# Patient Record
Sex: Female | Born: 1965 | Race: White | Hispanic: No | Marital: Married | Smoking: Never smoker
Health system: Southern US, Community
[De-identification: ages and names within clinical notes are randomized; demographics above are authoritative.]

## PROBLEM LIST (undated history)

## (undated) DIAGNOSIS — E785 Hyperlipidemia, unspecified: Secondary | ICD-10-CM

## (undated) DIAGNOSIS — E039 Hypothyroidism, unspecified: Secondary | ICD-10-CM

## (undated) DIAGNOSIS — Z789 Other specified health status: Secondary | ICD-10-CM

## (undated) DIAGNOSIS — E079 Disorder of thyroid, unspecified: Secondary | ICD-10-CM

## (undated) DIAGNOSIS — M8580 Other specified disorders of bone density and structure, unspecified site: Secondary | ICD-10-CM

## (undated) HISTORY — DX: Disorder of thyroid, unspecified: E07.9

## (undated) HISTORY — PX: INNER EAR SURGERY: SHX679

## (undated) HISTORY — PX: EYE SURGERY: SHX253

## (undated) HISTORY — DX: Hyperlipidemia, unspecified: E78.5

## (undated) HISTORY — DX: Other specified disorders of bone density and structure, unspecified site: M85.80

---

## 2005-05-16 ENCOUNTER — Ambulatory Visit: Payer: Self-pay | Admitting: Unknown Physician Specialty

## 2005-07-25 ENCOUNTER — Observation Stay: Payer: Self-pay | Admitting: Unknown Physician Specialty

## 2008-09-19 ENCOUNTER — Ambulatory Visit: Payer: Self-pay | Admitting: Family Medicine

## 2010-03-18 ENCOUNTER — Ambulatory Visit: Payer: Self-pay | Admitting: Unknown Physician Specialty

## 2010-03-23 ENCOUNTER — Ambulatory Visit: Payer: Self-pay | Admitting: Unknown Physician Specialty

## 2016-01-20 ENCOUNTER — Other Ambulatory Visit: Payer: Self-pay | Admitting: Internal Medicine

## 2016-01-20 DIAGNOSIS — R1310 Dysphagia, unspecified: Secondary | ICD-10-CM

## 2016-01-26 ENCOUNTER — Ambulatory Visit: Payer: Self-pay

## 2016-02-02 ENCOUNTER — Ambulatory Visit: Payer: Self-pay

## 2017-10-20 ENCOUNTER — Other Ambulatory Visit: Payer: Self-pay | Admitting: Radiology

## 2017-10-20 DIAGNOSIS — R1011 Right upper quadrant pain: Secondary | ICD-10-CM

## 2017-10-25 ENCOUNTER — Ambulatory Visit
Admission: RE | Admit: 2017-10-25 | Discharge: 2017-10-25 | Disposition: A | Discharge status: Home / Self Care | Payer: BLUE CROSS/BLUE SHIELD | Source: Ambulatory Visit | Attending: Family Medicine | Admitting: Family Medicine

## 2017-10-25 DIAGNOSIS — K8020 Calculus of gallbladder without cholecystitis without obstruction: Secondary | ICD-10-CM | POA: Diagnosis not present

## 2017-10-25 DIAGNOSIS — K760 Fatty (change of) liver, not elsewhere classified: Secondary | ICD-10-CM | POA: Insufficient documentation

## 2017-10-25 DIAGNOSIS — R1011 Right upper quadrant pain: Secondary | ICD-10-CM | POA: Insufficient documentation

## 2017-12-13 ENCOUNTER — Encounter: Payer: Self-pay | Admitting: *Deleted

## 2017-12-19 ENCOUNTER — Ambulatory Visit: Payer: Self-pay | Admitting: General Surgery

## 2017-12-25 ENCOUNTER — Ambulatory Visit (INDEPENDENT_AMBULATORY_CARE_PROVIDER_SITE_OTHER): Payer: BLUE CROSS/BLUE SHIELD | Admitting: General Surgery

## 2017-12-25 ENCOUNTER — Encounter: Payer: Self-pay | Admitting: General Surgery

## 2017-12-25 VITALS — BP 130/80 | HR 82 | Resp 12 | Ht 62.0 in | Wt 141.0 lb

## 2017-12-25 DIAGNOSIS — K8020 Calculus of gallbladder without cholecystitis without obstruction: Secondary | ICD-10-CM

## 2017-12-25 NOTE — Progress Notes (Signed)
Patient ID: Erika Lloyd, female   DOB: 1966/01/21, 52 y.o.   MRN: [id number]  Chief Complaint  Patient presents with  . Cholelithiasis    HPI Erika Lloyd is a 52 y.o. female referred here for evaluation of gallstones.  The patient has had several mild episodes of upper abdominal pain over the past few months, and recently had a more severe episode that prompted her to seek medical attention.  She was first evaluated for this on October 20, 2017 at the [facility] walk in clinic.  She subsequently had an ultrasound done on 10/25/17 based by identifying cholelithiasis. She recently followed up with her PCP who sent her for assessment for possible cholecystectomy.  The patient reports no severe episodes since early March.  Her pain was described as being in the epigastrium and right upper quadrant.  This is not recurred.  In the last week or so she has had some low back pain without other associated symptoms. Moves her bowels daily.   Discussion and exam completed with assistance of the Farsi interpreter through the language line.   HPI  Past Medical History:  Diagnosis Date  . Hyperlipidemia   . Osteopenia   . Thyroid disease     Past Surgical History:  Procedure Laterality Date  . CESAREAN SECTION     3x  . EYE SURGERY    . INNER EAR SURGERY      History reviewed. No pertinent family history.  Social History Social History   Tobacco Use  . Smoking status: Never Smoker  . Smokeless tobacco: Never Used  Substance Use Topics  . Alcohol use: Never    Frequency: Never  . Drug use: Not on file    No Known Allergies  Current Outpatient Medications  Medication Sig Dispense Refill  . atorvastatin (LIPITOR) 10 MG tablet Take 10 mg by mouth daily.    . ergocalciferol (VITAMIN D2) 50000 units capsule Take 50,000 Units by mouth once a week.    . levothyroxine (SYNTHROID, LEVOTHROID) 50 MCG tablet Take 50 mcg by mouth daily before breakfast.     No current facility-administered  medications for this visit.     Review of Systems Review of Systems  Constitutional: Negative.   Respiratory: Negative.   Cardiovascular: Negative.     Blood pressure 130/80, pulse 82, resp. rate 12, height  (1.575 m), weight 141 lb (64 kg).  Physical Exam Physical Exam  Constitutional: She is oriented to person, place, and time. She appears well-developed and well-nourished.  Cardiovascular: Normal rate, regular rhythm and normal heart sounds.  Pulmonary/Chest: Effort normal and breath sounds normal.  Abdominal: Soft. Bowel sounds are normal. There is no tenderness.    Neurological: She is alert and oriented to person, place, and time.  Skin: Skin is warm and dry.    Data Reviewed Abdominal ultrasound of October 25, 2017 was reviewed.  Common bile duct 2.4 mm.  No evidence of gallbladder wall thickening or pericholecystic fluid.  Gallstones noted.  Hepatic steatosis reported.  CBC of October 20, 2017 showed a hemoglobin of 15.4 with an MCV of 84.8, white blood cell count of 7000 based bar with a mild left shift of 72.5% neutrophils and a platelet count of 239,000. Comprehensive metabolic panel of the same date showed a nonfasting blood sugar of 115.  Normal electrolytes, renal function and liver function studies.  PCP notes of November 22, 2017 reviewed.  Assessment    Likely symptomatic cholelithiasis.    Plan  Options for  management were reviewed: 1) cholecystectomy for prevention of future episodes of pain versus 2) observation.  The patient had several questions about cost and was provided with a cost estimate.  Of note, she has a significant deductible of $11,000.  Laparoscopic Cholecystectomy with Intraoperative Cholangiogram. The procedure, including it's potential risks and complications (including but not limited to infection, bleeding, injury to intra-abdominal organs or bile ducts, bile leak, poor cosmetic result, sepsis and death) were discussed with the patient  in detail. Non-operative options, including their inherent risks (acute calculous cholecystitis with possible choledocholithiasis or gallstone pancreatitis, with the risk of ascending cholangitis, sepsis, and death) were discussed as well. The patient expressed and understanding of what we discussed and wishes to proceed with laparoscopic cholecystectomy. The patient further understands that if it is technically not possible, or it is unsafe to proceed laparoscopically, that I will convert to an open cholecystectomy.    HPI, Physical Exam, Assessment and Plan have been scribed under the direction and in the presence of Donnalee Curry, MD.  Ples Specter, CMA  I have completed the exam and reviewed the above documentation for accuracy and completeness.  I agree with the above.  Museum/gallery conservator has been used and any errors in dictation or transcription are unintentional.  Donnalee Curry, M.D., F.A.C.S.  Erika Lloyd 12/25/2017, 5:47 PM

## 2017-12-25 NOTE — Patient Instructions (Signed)

## 2018-04-13 ENCOUNTER — Emergency Department
Admission: EM | Admit: 2018-04-13 | Discharge: 2018-04-13 | Disposition: A | Discharge status: Home / Self Care | Payer: BLUE CROSS/BLUE SHIELD | Attending: Emergency Medicine | Admitting: Emergency Medicine

## 2018-04-13 ENCOUNTER — Emergency Department: Payer: BLUE CROSS/BLUE SHIELD

## 2018-04-13 DIAGNOSIS — K8050 Calculus of bile duct without cholangitis or cholecystitis without obstruction: Secondary | ICD-10-CM | POA: Diagnosis not present

## 2018-04-13 DIAGNOSIS — R52 Pain, unspecified: Secondary | ICD-10-CM

## 2018-04-13 DIAGNOSIS — R1031 Right lower quadrant pain: Secondary | ICD-10-CM | POA: Diagnosis present

## 2018-04-13 LAB — COMPREHENSIVE METABOLIC PANEL
ALT: 31 U/L (ref 0–44)
AST: 25 U/L (ref 15–41)
Albumin: 4.6 g/dL (ref 3.5–5.0)
Alkaline Phosphatase: 62 U/L (ref 38–126)
Anion gap: 6 (ref 5–15)
BILIRUBIN TOTAL: 0.6 mg/dL (ref 0.3–1.2)
BUN: 10 mg/dL (ref 6–20)
CHLORIDE: 105 mmol/L (ref 98–111)
CO2: 28 mmol/L (ref 22–32)
Calcium: 9.1 mg/dL (ref 8.9–10.3)
Creatinine, Ser: 0.69 mg/dL (ref 0.44–1.00)
Glucose, Bld: 118 mg/dL — ABNORMAL HIGH (ref 70–99)
Potassium: 3.6 mmol/L (ref 3.5–5.1)
Sodium: 139 mmol/L (ref 135–145)
Total Protein: 7.4 g/dL (ref 6.5–8.1)

## 2018-04-13 LAB — CBC
HEMATOCRIT: 42.7 % (ref 35.0–47.0)
HEMOGLOBIN: 15.1 g/dL (ref 12.0–16.0)
MCH: 30.2 pg (ref 26.0–34.0)
MCHC: 35.5 g/dL (ref 32.0–36.0)
MCV: 85.3 fL (ref 80.0–100.0)
PLATELETS: 236 10*3/uL (ref 150–440)
RBC: 5.01 MIL/uL (ref 3.80–5.20)
RDW: 12.7 % (ref 11.5–14.5)
WBC: 8.7 10*3/uL (ref 3.6–11.0)

## 2018-04-13 LAB — URINALYSIS, COMPLETE (UACMP) WITH MICROSCOPIC
BACTERIA UA: NONE SEEN
BILIRUBIN URINE: NEGATIVE
Glucose, UA: NEGATIVE mg/dL
Hgb urine dipstick: NEGATIVE
Ketones, ur: NEGATIVE mg/dL
LEUKOCYTES UA: NEGATIVE
Nitrite: NEGATIVE
PH: 7 (ref 5.0–8.0)
Protein, ur: NEGATIVE mg/dL
SPECIFIC GRAVITY, URINE: 1.015 (ref 1.005–1.030)
WBC, UA: NONE SEEN WBC/hpf (ref 0–5)

## 2018-04-13 LAB — POCT PREGNANCY, URINE: PREG TEST UR: NEGATIVE

## 2018-04-13 MED ORDER — ONDANSETRON 4 MG PO TBDP: 4.0000 mg | ORAL_TABLET | Freq: Three times a day (TID) | ORAL | 0 refills | Status: DC | PRN

## 2018-04-13 MED ORDER — OXYCODONE-ACETAMINOPHEN 5-325 MG PO TABS: 1.0000 | ORAL_TABLET | Freq: Three times a day (TID) | ORAL | 0 refills | Status: DC | PRN

## 2018-04-13 MED ORDER — MORPHINE SULFATE (PF) 4 MG/ML IV SOLN
4.0000 mg | Freq: Once | INTRAVENOUS | Status: AC
  Administered 2018-04-13: 4 mg via INTRAVENOUS
  Filled 2018-04-13: qty 1

## 2018-04-13 MED ORDER — FENTANYL CITRATE (PF) 100 MCG/2ML IJ SOLN
50.0000 ug | Freq: Once | INTRAMUSCULAR | Status: AC
  Administered 2018-04-13: 50 ug via INTRAVENOUS
  Filled 2018-04-13: qty 2

## 2018-04-13 NOTE — ED Provider Notes (Signed)
[facility] Emergency Department Provider Note       Time seen: ----------------------------------------- 7:28 AM on 04/13/2018 -----------------------------------------   I have reviewed the triage vital signs and the nursing notes.  HISTORY   Chief Complaint Flank Pain    HPI Erika Lloyd is a 52 y.o. female with a history of hyperlipidemia and thyroid disease who presents to the ED for right-sided abdominal pain.  Patient complains of right-sided pain radiating into her lower abdomen beginning at 2 AM.  Patient reports this pain was similar when she was diagnosed with gallstones 3 months ago.  She has had some nausea and vomiting.  Cholecystectomy is scheduled in 4 days.  Past Medical History:  Diagnosis Date  . Hyperlipidemia   . Osteopenia   . Thyroid disease     Patient Active Problem List   Diagnosis Date Noted  . Gallstones 12/25/2017    Past Surgical History:  Procedure Laterality Date  . CESAREAN SECTION     3x  . EYE SURGERY    . INNER EAR SURGERY      Allergies Patient has no known allergies.  Social History Social History   Tobacco Use  . Smoking status: Never Smoker  . Smokeless tobacco: Never Used  Substance Use Topics  . Alcohol use: Never    Frequency: Never  . Drug use: Not on file   Review of Systems Constitutional: Negative for fever. Cardiovascular: Negative for chest pain. Respiratory: Negative for shortness of breath. Gastrointestinal: Positive for abdominal pain, vomiting Musculoskeletal: Negative for back pain. Skin: Negative for rash. Neurological: Negative for headaches, focal weakness or numbness.  All systems negative/normal/unremarkable except as stated in the HPI  ____________________________________________   PHYSICAL EXAM:  VITAL SIGNS: ED Triage Vitals [04/13/18 0548]  Enc Vitals Group     BP (!) 109/96     Pulse Rate 80     Resp 18     Temp 98.4 F (36.9 C)     Temp Source Oral     SpO2 100 %     Weight 141 lb 1.5 oz (64 kg)     Height      Head Circumference      Peak Flow      Pain Score 0     Pain Loc      Pain Edu?      Excl. in GC?    Constitutional: Alert and oriented.  Mild distress Eyes: Conjunctivae are normal. Normal extraocular movements. ENT   Head: Normocephalic and atraumatic.   Nose: No congestion/rhinnorhea.   Mouth/Throat: Mucous membranes are moist.   Neck: No stridor. Cardiovascular: Normal rate, regular rhythm. No murmurs, rubs, or gallops. Respiratory: Normal respiratory effort without tachypnea nor retractions. Breath sounds are clear and equal bilaterally. No wheezes/rales/rhonchi. Gastrointestinal: Right upper quadrant tenderness, no rebound or guarding.  Normal bowel sounds. Musculoskeletal: Nontender with normal range of motion in extremities. No lower extremity tenderness nor edema. Neurologic:  Normal speech and language. No gross focal neurologic deficits are appreciated.  Skin:  Skin is warm, dry and intact. No rash noted. Psychiatric: Mood and affect are normal. Speech and behavior are normal.  ____________________________________________  ED COURSE:  As part of my medical decision making, I reviewed the following data within the electronic MEDICAL RECORD NUMBER History obtained from family if available, nursing notes, old chart and ekg, as well as notes from prior ED visits. Patient presented for right upper quadrant abdominal pain, we will assess with labs and imaging  as indicated at this time.   Procedures ____________________________________________   LABS (pertinent positives/negatives)  Labs Reviewed  URINALYSIS, COMPLETE (UACMP) WITH MICROSCOPIC - Abnormal; Notable for the following components:      Result Value   Color, Urine YELLOW (*)    APPearance CLOUDY (*)    All other components within normal limits  COMPREHENSIVE METABOLIC PANEL - Abnormal; Notable for the following components:   Glucose, Bld 118  (*)    All other components within normal limits  CBC  POC URINE PREG, ED  POCT PREGNANCY, URINE    RADIOLOGY Images were viewed by me  Right upper quadrant ultrasound IMPRESSION: Cholelithiasis. No sonographic evidence of acute cholecystitis or biliary ductal dilatation. ____________________________________________  DIFFERENTIAL DIAGNOSIS   Biliary colic, cholecystitis, renal colic, UTI, pyelonephritis  FINAL ASSESSMENT AND PLAN  Gallstones   Plan: The patient had presented for right upper quadrant pain and vomiting. Patient's labs were reassuring. Patient's imaging did reveal gallstones but no other acute process.  She is likely presenting for biliary colic.  I will discuss with her general surgeon.   Ulice Dash, MD   Note: This note was generated in part or whole with voice recognition software. Voice recognition is usually quite accurate but there are transcription errors that can and very often do occur. I apologize for any typographical errors that were not detected and corrected.     Emily Filbert, MD 04/13/18 [phone]

## 2018-04-13 NOTE — ED Notes (Signed)
Pt went to ultrasound.

## 2018-04-13 NOTE — ED Triage Notes (Signed)
Patient c/o right flank pain radiating to lower abdomen beginning at 0200 today. Patient reports this pain similar to when she was dx with gallstones on 5/13. Patient has cholecystectomy scheduled for 9.3.

## 2018-04-13 NOTE — ED Provider Notes (Signed)
I discussed the patient with general surgery, there is no availability for cholecystectomy today.  She will be discharged with pain medicine and antiemetics and encouraged to continue outpatient follow-up as scheduled.   Emily FilbertWilliams, Khamauri Bauernfeind E, MD 04/13/18 1022

## 2018-04-17 ENCOUNTER — Ambulatory Visit (INDEPENDENT_AMBULATORY_CARE_PROVIDER_SITE_OTHER): Payer: BLUE CROSS/BLUE SHIELD | Admitting: General Surgery

## 2018-04-17 ENCOUNTER — Encounter: Payer: Self-pay | Admitting: General Surgery

## 2018-04-17 VITALS — BP 124/72 | HR 72 | Resp 12 | Ht 62.0 in | Wt 136.0 lb

## 2018-04-17 DIAGNOSIS — K8020 Calculus of gallbladder without cholecystitis without obstruction: Secondary | ICD-10-CM

## 2018-04-17 NOTE — Progress Notes (Signed)
Patient ID: Erika Lloyd, female   DOB: Oct 20, 1965, 52 y.o.   MRN: [id number]  Chief Complaint  Patient presents with  . Follow-up    HPI Erika Lloyd is a 52 y.o. female here today for her follow up gallstones. Patient states she was seen in the ED on 04-13-18 for severe right abdominal pain last 2-3 hours. She thinks that when she eats beans and meat she has the pain.she does not eat fried foods. Sometimes she has pain for 2-3 hours that is not bad enough to go to the ED. She admits to vomiting, nausea and diarrhea as well.  These of all resolved.  Minimal residual, mild right upper quadrant discomfort. She was seen in May 2019 for similar symptoms.  HPI  Past Medical History:  Diagnosis Date  . Hyperlipidemia   . Osteopenia   . Thyroid disease     Past Surgical History:  Procedure Laterality Date  . CESAREAN SECTION     3x  . EYE SURGERY    . INNER EAR SURGERY      No family history on file.  Social History Social History   Tobacco Use  . Smoking status: Never Smoker  . Smokeless tobacco: Never Used  Substance Use Topics  . Alcohol use: Never    Frequency: Never  . Drug use: Not on file    No Known Allergies  Current Outpatient Medications  Medication Sig Dispense Refill  . atorvastatin (LIPITOR) 10 MG tablet Take 10 mg by mouth daily.    Marland Kitchen oxyCODONE-acetaminophen (PERCOCET/ROXICET) 5-325 MG tablet Take 1 tablet by mouth every 8 (eight) hours as needed for severe pain.     . Cholecalciferol (VITAMIN D3 PO) Take 1 capsule by mouth daily.     No current facility-administered medications for this visit.     Review of Systems Review of Systems  Constitutional: Negative.   Respiratory: Negative.   Cardiovascular: Negative.   Gastrointestinal: Positive for abdominal pain, diarrhea, nausea and vomiting.    Blood pressure 124/72, pulse 72, resp. rate 12, height 5\' 2"  (1.575 m), weight 136 lb (61.7 kg), SpO2 97 %.  Physical Exam Physical Exam  Constitutional:  She is oriented to person, place, and time. She appears well-developed and well-nourished.  HENT:  Mouth/Throat: No oropharyngeal exudate.  Eyes: No scleral icterus.  Neck: Neck supple.  Cardiovascular: Normal rate, regular rhythm and normal heart sounds.  Pulmonary/Chest: Effort normal and breath sounds normal.  Abdominal:    Neurological: She is alert and oriented to person, place, and time.  Skin: Skin is warm and dry.  Psychiatric: Her behavior is normal.    Data Reviewed April 13, 2018 repeat ultrasound:   Cholelithiasis. No sonographic evidence of acute cholecystitis or biliary ductal dilatation. Common bile duct: 0.28 cm.  These images were reviewed. Laboratory studies of the same date reviewed.  Except for nonfasting blood sugar 118 all within normal limits.  Normal liver function studies.  Pregnancy test negative.  Assessment    Chronic cholecystitis, cholelithiasis.    Plan  Attempt to make use of the language line due to the patient's primary language being Farsi was unsuccessful as we were disconnected by the translator.  During her May 2019 visit I had reviewed in detail with the translating service pros and cons of surgery, at that time she had declined as she had been asymptomatic for 1-2 months and had a significant healthcare deductible.  With her most recent episode of pain she is more interested in proceeding  with elective surgery.  Laparoscopic Cholecystectomy with Intraoperative Cholangiogram. The procedure, including it's potential risks and complications (including but not limited to infection, bleeding, injury to intra-abdominal organs or bile ducts, bile leak, poor cosmetic result, sepsis and death) were discussed with the patient in detail. Non-operative options, including their inherent risks (acute calculous cholecystitis with possible choledocholithiasis or gallstone pancreatitis, with the risk of ascending cholangitis, sepsis, and death) were discussed  as well. The patient expressed and understanding of what we discussed and wishes to proceed with laparoscopic cholecystectomy. The patient further understands that if it is technically not possible, or it is unsafe to proceed laparoscopically, that I will convert to an open cholecystectomy.  HPI, Physical Exam, Assessment and Plan have been scribed under the direction and in the presence of Earline Mayotte, MD. Dorathy Daft, RN  I have completed the exam and reviewed the above documentation for accuracy and completeness.  I agree with the above.  Museum/gallery conservator has been used and any errors in dictation or transcription are unintentional.  Donnalee Curry, M.D., F.A.C.S.  The patient is scheduled for surgery at [facility] on 04/18/18. She will pre admit that morning 2 hours prior to surgery. The patient is aware of date and instructions.  Documented by Caryl-Lyn Louanna Raw LPN  Merrily Pew Byrnett 04/17/2018, 7:44 PM

## 2018-04-17 NOTE — Patient Instructions (Addendum)

## 2018-04-18 ENCOUNTER — Ambulatory Visit
Admission: RE | Admit: 2018-04-18 | Discharge: 2018-04-18 | Disposition: A | Discharge status: Home / Self Care | Payer: BLUE CROSS/BLUE SHIELD | Source: Ambulatory Visit | Attending: General Surgery | Admitting: General Surgery

## 2018-04-18 ENCOUNTER — Ambulatory Visit: Payer: BLUE CROSS/BLUE SHIELD

## 2018-04-18 ENCOUNTER — Encounter
Admission: RE | Disposition: A | Discharge status: Home / Self Care | Payer: Self-pay | Source: Ambulatory Visit | Attending: General Surgery

## 2018-04-18 DIAGNOSIS — K8020 Calculus of gallbladder without cholecystitis without obstruction: Secondary | ICD-10-CM | POA: Diagnosis not present

## 2018-04-18 DIAGNOSIS — K8010 Calculus of gallbladder with chronic cholecystitis without obstruction: Secondary | ICD-10-CM | POA: Diagnosis not present

## 2018-04-18 DIAGNOSIS — Z419 Encounter for procedure for purposes other than remedying health state, unspecified: Secondary | ICD-10-CM

## 2018-04-18 DIAGNOSIS — R109 Unspecified abdominal pain: Secondary | ICD-10-CM | POA: Diagnosis present

## 2018-04-18 HISTORY — DX: Other specified health status: Z78.9

## 2018-04-18 HISTORY — PX: CHOLECYSTECTOMY: SHX55

## 2018-04-18 SURGERY — LAPAROSCOPIC CHOLECYSTECTOMY WITH INTRAOPERATIVE CHOLANGIOGRAM
Anesthesia: General

## 2018-04-18 MED ORDER — SUGAMMADEX SODIUM 200 MG/2ML IV SOLN
INTRAVENOUS | Status: AC
  Filled 2018-04-18: qty 2

## 2018-04-18 MED ORDER — ROCURONIUM BROMIDE 100 MG/10ML IV SOLN
INTRAVENOUS | Status: DC | PRN
  Administered 2018-04-18: 30 mg via INTRAVENOUS

## 2018-04-18 MED ORDER — FENTANYL CITRATE (PF) 100 MCG/2ML IJ SOLN
25.0000 ug | INTRAMUSCULAR | Status: DC | PRN
  Administered 2018-04-18: 50 ug via INTRAVENOUS

## 2018-04-18 MED ORDER — SODIUM CHLORIDE 0.9 % IV SOLN
INTRAVENOUS | Status: DC | PRN
  Administered 2018-04-18: 10 mL

## 2018-04-18 MED ORDER — SODIUM CHLORIDE 0.9 % IJ SOLN
INTRAMUSCULAR | Status: AC
  Filled 2018-04-18: qty 50

## 2018-04-18 MED ORDER — MIDAZOLAM HCL 2 MG/2ML IJ SOLN
INTRAMUSCULAR | Status: DC | PRN
  Administered 2018-04-18: 2 mg via INTRAVENOUS

## 2018-04-18 MED ORDER — ONDANSETRON HCL 4 MG/2ML IJ SOLN
INTRAMUSCULAR | Status: DC | PRN
  Administered 2018-04-18: 4 mg via INTRAVENOUS

## 2018-04-18 MED ORDER — PROPOFOL 10 MG/ML IV BOLUS
INTRAVENOUS | Status: AC
  Filled 2018-04-18: qty 20

## 2018-04-18 MED ORDER — HYDROCODONE-ACETAMINOPHEN 5-325 MG PO TABS
1.0000 | ORAL_TABLET | ORAL | Status: DC | PRN
  Administered 2018-04-18: 1 via ORAL

## 2018-04-18 MED ORDER — MIDAZOLAM HCL 2 MG/2ML IJ SOLN
INTRAMUSCULAR | Status: AC
  Filled 2018-04-18: qty 2

## 2018-04-18 MED ORDER — SODIUM CHLORIDE 0.9 % IJ SOLN
INTRAMUSCULAR | Status: DC | PRN
  Administered 2018-04-18: 5 mL via INTRAVENOUS

## 2018-04-18 MED ORDER — LACTATED RINGERS IV SOLN
INTRAVENOUS | Status: DC
  Administered 2018-04-18: 11:00:00 via INTRAVENOUS

## 2018-04-18 MED ORDER — ACETAMINOPHEN 10 MG/ML IV SOLN
INTRAVENOUS | Status: AC
  Filled 2018-04-18: qty 100

## 2018-04-18 MED ORDER — FAMOTIDINE 20 MG PO TABS
ORAL_TABLET | ORAL | Status: AC
  Administered 2018-04-18: 20 mg via ORAL
  Filled 2018-04-18: qty 1

## 2018-04-18 MED ORDER — FENTANYL CITRATE (PF) 100 MCG/2ML IJ SOLN
INTRAMUSCULAR | Status: AC
  Filled 2018-04-18: qty 2

## 2018-04-18 MED ORDER — PROMETHAZINE HCL 25 MG/ML IJ SOLN: 6.2500 mg | INTRAMUSCULAR | Status: DC | PRN

## 2018-04-18 MED ORDER — FAMOTIDINE 20 MG PO TABS
20.0000 mg | ORAL_TABLET | Freq: Once | ORAL | Status: AC
  Administered 2018-04-18: 20 mg via ORAL

## 2018-04-18 MED ORDER — KETOROLAC TROMETHAMINE 30 MG/ML IJ SOLN
INTRAMUSCULAR | Status: DC | PRN
  Administered 2018-04-18: 30 mg via INTRAVENOUS

## 2018-04-18 MED ORDER — KETOROLAC TROMETHAMINE 30 MG/ML IJ SOLN
INTRAMUSCULAR | Status: AC
  Filled 2018-04-18: qty 1

## 2018-04-18 MED ORDER — SUGAMMADEX SODIUM 200 MG/2ML IV SOLN
INTRAVENOUS | Status: DC | PRN
  Administered 2018-04-18: 200 mg via INTRAVENOUS

## 2018-04-18 MED ORDER — DEXAMETHASONE SODIUM PHOSPHATE 10 MG/ML IJ SOLN
INTRAMUSCULAR | Status: DC | PRN
  Administered 2018-04-18: 4 mg via INTRAVENOUS

## 2018-04-18 MED ORDER — FENTANYL CITRATE (PF) 100 MCG/2ML IJ SOLN
INTRAMUSCULAR | Status: DC | PRN
  Administered 2018-04-18 (×3): 50 ug via INTRAVENOUS

## 2018-04-18 MED ORDER — HYDROCODONE-ACETAMINOPHEN 5-325 MG PO TABS
ORAL_TABLET | ORAL | Status: AC
  Filled 2018-04-18: qty 1

## 2018-04-18 MED ORDER — PROPOFOL 10 MG/ML IV BOLUS
INTRAVENOUS | Status: DC | PRN
  Administered 2018-04-18: 100 mg via INTRAVENOUS

## 2018-04-18 MED ORDER — LIDOCAINE HCL (CARDIAC) PF 100 MG/5ML IV SOSY
PREFILLED_SYRINGE | INTRAVENOUS | Status: DC | PRN
  Administered 2018-04-18: 40 mg via INTRAVENOUS

## 2018-04-18 MED ORDER — ACETAMINOPHEN 10 MG/ML IV SOLN
INTRAVENOUS | Status: DC | PRN
  Administered 2018-04-18: 1000 mg via INTRAVENOUS

## 2018-04-18 MED ORDER — HYDROCODONE-ACETAMINOPHEN 5-325 MG PO TABS: 1.0000 | ORAL_TABLET | ORAL | 0 refills | Status: DC | PRN

## 2018-04-18 SURGICAL SUPPLY — 40 items
APPLIER CLIP ROT 10 11.4 M/L (STAPLE) ×3
BLADE SURG 11 STRL SS SAFETY (MISCELLANEOUS) ×3 IMPLANT
CANISTER SUCT 1200ML W/VALVE (MISCELLANEOUS) ×3 IMPLANT
CANNULA DILATOR  5MM W/SLV (CANNULA) ×2
CANNULA DILATOR 10 W/SLV (CANNULA) ×2 IMPLANT
CANNULA DILATOR 10MM W/SLV (CANNULA) ×1
CANNULA DILATOR 5 W/SLV (CANNULA) ×4 IMPLANT
CATH CHOLANG 76X19 KUMAR (CATHETERS) ×3 IMPLANT
CHLORAPREP W/TINT 26ML (MISCELLANEOUS) ×3 IMPLANT
CLIP APPLIE ROT 10 11.4 M/L (STAPLE) ×1 IMPLANT
CLOSURE WOUND 1/2 X4 (GAUZE/BANDAGES/DRESSINGS) ×1
CONRAY 60ML FOR OR (MISCELLANEOUS) ×3 IMPLANT
DISSECTOR KITTNER STICK (MISCELLANEOUS) IMPLANT
DISSECTORS/KITTNER STICK (MISCELLANEOUS)
DRAPE SHEET LG 3/4 BI-LAMINATE (DRAPES) ×3 IMPLANT
DRSG TEGADERM 2-3/8X2-3/4 SM (GAUZE/BANDAGES/DRESSINGS) ×12 IMPLANT
DRSG TELFA 4X3 1S NADH ST (GAUZE/BANDAGES/DRESSINGS) ×3 IMPLANT
ELECT REM PT RETURN 9FT ADLT (ELECTROSURGICAL) ×3
ELECTRODE REM PT RTRN 9FT ADLT (ELECTROSURGICAL) ×1 IMPLANT
GLOVE BIO SURGEON STRL SZ7.5 (GLOVE) ×12 IMPLANT
GLOVE INDICATOR 8.0 STRL GRN (GLOVE) ×12 IMPLANT
GOWN STRL REUS W/ TWL LRG LVL3 (GOWN DISPOSABLE) ×3 IMPLANT
GOWN STRL REUS W/TWL LRG LVL3 (GOWN DISPOSABLE) ×6
IRRIGATION STRYKERFLOW (MISCELLANEOUS) ×1 IMPLANT
IRRIGATOR STRYKERFLOW (MISCELLANEOUS) ×3
IV LACTATED RINGERS 1000ML (IV SOLUTION) ×3 IMPLANT
KIT TURNOVER KIT A (KITS) ×3 IMPLANT
LABEL OR SOLS (LABEL) ×3 IMPLANT
NDL INSUFF ACCESS 14 VERSASTEP (NEEDLE) ×3 IMPLANT
NS IRRIG 500ML POUR BTL (IV SOLUTION) ×3 IMPLANT
PACK LAP CHOLECYSTECTOMY (MISCELLANEOUS) ×3 IMPLANT
POUCH SPECIMEN RETRIEVAL 10MM (ENDOMECHANICALS) IMPLANT
SCISSORS METZENBAUM CVD 33 (INSTRUMENTS) ×3 IMPLANT
STRIP CLOSURE SKIN 1/2X4 (GAUZE/BANDAGES/DRESSINGS) ×2 IMPLANT
SUT VIC AB 0 CT2 27 (SUTURE) ×3 IMPLANT
SUT VIC AB 4-0 FS2 27 (SUTURE) ×3 IMPLANT
SWABSTK COMLB BENZOIN TINCTURE (MISCELLANEOUS) ×3 IMPLANT
TROCAR XCEL NON-BLD 11X100MML (ENDOMECHANICALS) ×3 IMPLANT
TUBING INSUFFLATION (TUBING) ×3 IMPLANT
WATER STERILE IRR 1000ML POUR (IV SOLUTION) ×3 IMPLANT

## 2018-04-18 NOTE — Anesthesia Procedure Notes (Signed)
Procedure Name: Intubation Date/Time: 04/18/2018 12:19 PM Performed by: Jonna Clark, CRNA Pre-anesthesia Checklist: Patient identified, Emergency Drugs available, Suction available, Patient being monitored and Timeout performed Patient Re-evaluated:Patient Re-evaluated prior to induction Oxygen Delivery Method: Circle system utilized Preoxygenation: Pre-oxygenation with 100% oxygen Induction Type: IV induction Ventilation: Mask ventilation without difficulty Laryngoscope Size: Mac and 3 Grade View: Grade I Tube type: Oral Tube size: 7.0 mm Number of attempts: 1 Airway Equipment and Method: Stylet Placement Confirmation: ETT inserted through vocal cords under direct vision,  positive ETCO2 and breath sounds checked- equal and bilateral Secured at: 21 cm Tube secured with: Tape

## 2018-04-18 NOTE — Anesthesia Post-op Follow-up Note (Signed)
Anesthesia QCDR form completed.        

## 2018-04-18 NOTE — Discharge Instructions (Signed)

## 2018-04-18 NOTE — Op Note (Signed)
Preoperative diagnosis: Chronic cholecystitis and cholelithiasis.  Postoperative diagnosis: Same.  Operative procedure: Laparoscopic cholecystectomy with cholangiograms.  Operating Surgeon: Donnalee Curry, MD.  Anesthesia: General endotracheal.  Estimated blood loss: Less than 5 cc.  Clinical note: This 52 year old woman is had 2 episodes of severe abdominal pain.  Ultrasound showed evidence of cholelithiasis.  She was admitted for elective cholecystectomy.  Operative note: With the patient under adequate general endotracheal anesthesia the abdomen was prepped with ChloraPrep and draped.  In Trendelenburg position a varies needle was placed through a trans-umbilical incision.  After assuring intra-abdominal location with a hanging drop test the abdomen was insufflated with CO2 of 10 mmHg mercury.  A 10 mm Step port was expanded and inspection showed no evidence of injury from initial port placement.  The patient was placed in the reverse Trendelenburg position and rolled to the left.  An 11 mm XL port was placed in the epigastrium followed by the placement 2-5 mm Step ports in the right lateral abdominal wall.  The gallbladder was noted to be markedly contracted.  This was placed on cephalad traction.  The neck of the gallbladder was cleared and fluoroscopic cholangiograms completed using a total of 35 cc of one half strength Conray 60.  There was slow filling of the cystic duct and gradual filling of the common bile duct and flow into the duodenum.  The common bile duct was not dilated.  In spite of repositioning the patient and traction on the gallbladder reflux into the common hepatic duct could not be confirmed.  The dissection was carried out adjacent to the gallbladder.  The critical view of safety was obtained.  A prominent vein along the cystic duct was doubly clipped followed by the cystic duct and cystic artery.  The latter was quite small.  The gallbladder was dissected from the liver bed  making use of hook cautery dissection.  Thick, viscous white "milk of calcium" bile was noted.  The gallbladder was placed into an Endo Catch bag and then delivered to the umbilical port site.  Inspection showed a single faceted stone.  After reestablishing pneumoperitoneum inspection from the epigastric site showed no evidence of injury from initial port placement.  The right upper quadrant was irrigated with lactated Ringer solution.  Air and fluid was aspirated from the stomach and the abdomen desufflated and ports removed under direct vision.  The fascia at the umbilicus was closed with a 0 Vicryl simple suture as was the fascia in the epigastric port site.  Skin incisions were closed with 4-0 Vicryl sub-cuticular sutures.  Benzoin, Steri-Strips, Telfa and Tegaderm dressings were applied.  The patient tolerated the procedure well and was taken to recovery room in stable condition.

## 2018-04-18 NOTE — Transfer of Care (Signed)
Immediate Anesthesia Transfer of Care Note  Patient: Erika Lloyd  Procedure(s) Performed: LAPAROSCOPIC CHOLECYSTECTOMY WITH INTRAOPERATIVE CHOLANGIOGRAM (N/A )  Patient Location: PACU  Anesthesia Type:General  Level of Consciousness: sedated  Airway & Oxygen Therapy: Patient Spontanous Breathing  Post-op Assessment: Report given to RN  Post vital signs: stable  Last Vitals:  Vitals Value Taken Time  BP 119/54 04/18/2018  1:32 PM  Temp    Pulse 73 04/18/2018  1:33 PM  Resp 19 04/18/2018  1:33 PM  SpO2 100 % 04/18/2018  1:33 PM  Vitals shown include unvalidated device data.  Last Pain:  Vitals:   04/18/18 1029  TempSrc: Temporal  PainSc: 0-No pain         Complications: No apparent anesthesia complications

## 2018-04-18 NOTE — H&P (Signed)
No change in clinical condition or exam. For cholecystectomy.  

## 2018-04-18 NOTE — Anesthesia Preprocedure Evaluation (Signed)
Anesthesia Evaluation  Patient identified by MRN, date of birth, ID band Patient awake    Reviewed: Allergy & Precautions, H&P , NPO status , Patient's Chart, lab work & pertinent test results, reviewed documented beta blocker date and time   History of Anesthesia Complications Negative for: history of anesthetic complications  Airway Mallampati: I  TM Distance: >3 FB Neck ROM: full    Dental  (+) Dental Advidsory Given, Missing, Teeth Intact   Pulmonary neg pulmonary ROS,           Cardiovascular Exercise Tolerance: Good negative cardio ROS       Neuro/Psych negative neurological ROS  negative psych ROS   GI/Hepatic negative GI ROS, Neg liver ROS,   Endo/Other  negative endocrine ROS  Renal/GU negative Renal ROS  negative genitourinary   Musculoskeletal   Abdominal   Peds  Hematology negative hematology ROS (+)   Anesthesia Other Findings Past Medical History: No date: Hyperlipidemia No date: Medical history non-contributory No date: Osteopenia No date: Thyroid disease   Reproductive/Obstetrics negative OB ROS                             Anesthesia Physical Anesthesia Plan  ASA: II  Anesthesia Plan: General   Post-op Pain Management:    Induction: Intravenous  PONV Risk Score and Plan: 3 and Ondansetron, Dexamethasone, Midazolam and Promethazine  Airway Management Planned: Oral ETT  Additional Equipment:   Intra-op Plan:   Post-operative Plan: Extubation in OR  Informed Consent: I have reviewed the patients History and Physical, chart, labs and discussed the procedure including the risks, benefits and alternatives for the proposed anesthesia with the patient or authorized representative who has indicated his/her understanding and acceptance.   Dental Advisory Given  Plan Discussed with: Anesthesiologist, CRNA and Surgeon  Anesthesia Plan Comments:          Anesthesia Quick Evaluation

## 2018-04-21 LAB — SURGICAL PATHOLOGY

## 2018-04-21 NOTE — Anesthesia Postprocedure Evaluation (Signed)
Anesthesia Post Note  Patient: Erika Lloyd  Procedure(s) Performed: LAPAROSCOPIC CHOLECYSTECTOMY WITH INTRAOPERATIVE CHOLANGIOGRAM (N/A )  Patient location during evaluation: PACU Anesthesia Type: General Level of consciousness: awake and alert Pain management: pain level controlled Vital Signs Assessment: post-procedure vital signs reviewed and stable Respiratory status: spontaneous breathing, nonlabored ventilation, respiratory function stable and patient connected to nasal cannula oxygen Cardiovascular status: blood pressure returned to baseline and stable Postop Assessment: no apparent nausea or vomiting Anesthetic complications: no     Last Vitals:  Vitals:   04/18/18 1429 04/18/18 1513  BP: (!) 118/57 118/65  Pulse: 72 68  Resp: 16 16  Temp: (!) 36.3 C   SpO2: 100% 100%    Last Pain:  Vitals:   04/18/18 1451  TempSrc:   PainSc: 4                  Lenard Simmer

## 2018-04-26 ENCOUNTER — Ambulatory Visit (INDEPENDENT_AMBULATORY_CARE_PROVIDER_SITE_OTHER): Payer: BLUE CROSS/BLUE SHIELD | Admitting: General Surgery

## 2018-04-26 ENCOUNTER — Encounter: Payer: Self-pay | Admitting: General Surgery

## 2018-04-26 VITALS — BP 114/70 | HR 67 | Resp 12 | Ht 62.0 in | Wt 136.0 lb

## 2018-04-26 DIAGNOSIS — K8020 Calculus of gallbladder without cholecystitis without obstruction: Secondary | ICD-10-CM

## 2018-04-26 NOTE — Patient Instructions (Addendum)
The patient is aware to call back for any questions or concerns. Follow up as needed. Resume activities as tolerated. 

## 2018-04-26 NOTE — Progress Notes (Signed)
Patient ID: Erika Lloyd, female   DOB: 02/07/1966, 52 y.o.   MRN: [id number]  Chief Complaint  Patient presents with  . Routine Post Op    HPI Erika Lloyd is a 52 y.o. female.  Here today for postoperative visit, laparoscopic cholecystectomy on 04-18-18, she states she is doing well, still sore. Denies any gastrointestinal issues, bowels are moving regular.  HPI  Past Medical History:  Diagnosis Date  . Hyperlipidemia   . Medical history non-contributory   . Osteopenia   . Thyroid disease     Past Surgical History:  Procedure Laterality Date  . CESAREAN SECTION     3x  . CHOLECYSTECTOMY N/A 04/18/2018   Procedure: LAPAROSCOPIC CHOLECYSTECTOMY WITH INTRAOPERATIVE CHOLANGIOGRAM;  Surgeon: Earline MayotteByrnett, Karri Kallenbach W, MD;  Location: [facility] ORS;  Service: General;  Laterality: N/A;  . EYE SURGERY    . INNER EAR SURGERY      No family history on file.  Social History Social History   Tobacco Use  . Smoking status: Never Smoker  . Smokeless tobacco: Never Used  Substance Use Topics  . Alcohol use: Never    Frequency: Never  . Drug use: Never    No Known Allergies  Current Outpatient Medications  Medication Sig Dispense Refill  . atorvastatin (LIPITOR) 10 MG tablet Take 10 mg by mouth daily.    . Cholecalciferol (VITAMIN D3 PO) Take 1 capsule by mouth daily.     No current facility-administered medications for this visit.     Review of Systems Review of Systems  Constitutional: Negative.   Respiratory: Negative.   Cardiovascular: Negative.     Blood pressure 114/70, pulse 67, resp. rate 12, height 5\' 2"  (1.575 m), weight 136 lb (61.7 kg), SpO2 99 %.  Physical Exam Physical Exam  Constitutional: She appears well-developed and well-nourished.  Cardiovascular: Normal rate, regular rhythm and normal heart sounds.  Pulmonary/Chest: Effort normal and breath sounds normal.  Abdominal: Soft. Bowel sounds are normal. There is no tenderness.  Skin: Skin is warm and dry.   Psychiatric: Her behavior is normal.  Port sites healing well.  Data Reviewed .  April 18, 2018 pathology: GALLBLADDER; CHOLECYSTECTOMY:  -CHOLELITHIASIS AND CHRONIC CHOLECYSTITIS.  A gallstone was identified in the resected specimen, not received in the laboratory.  Assessment    Doing well post cholecystectomy.    Plan    Follow up as needed. Resume activities as tolerated.  The patient is aware to call back for any questions or new concerns.      HPI, Physical Exam, Assessment and Plan have been scribed under the direction and in the presence of Earline MayotteJeffrey W. Vyron Fronczak, MD. Dorathy DaftMarsha Hatch, RN I have completed the exam and reviewed the above documentation for accuracy and completeness.  I agree with the above.  [employer] has been used and any errors in dictation or transcription are unintentional.  Donnalee CurryJeffrey Lemarcus Baggerly, M.D., F.A.C.S.   Merrily PewJeffrey W Maxim Bedel 04/27/2018, 7:14 AM

## 2019-10-16 IMAGING — XA DG CHOLANGIOGRAM OPERATIVE
4 series · 13 of 13 positions shown · non-contrast
Comparison: Right upper quadrant abdominal ultrasound - 04/13/2018

CLINICAL DATA: Intraoperative cholangiogram during laparoscopic
cholecystectomy.

EXAM:
INTRAOPERATIVE CHOLANGIOGRAM
FLUOROSCOPY TIME:  48 seconds

[Series 1: vasc standard · 4 of 405 frames shown (1 of 4)]
[frame 60/405]
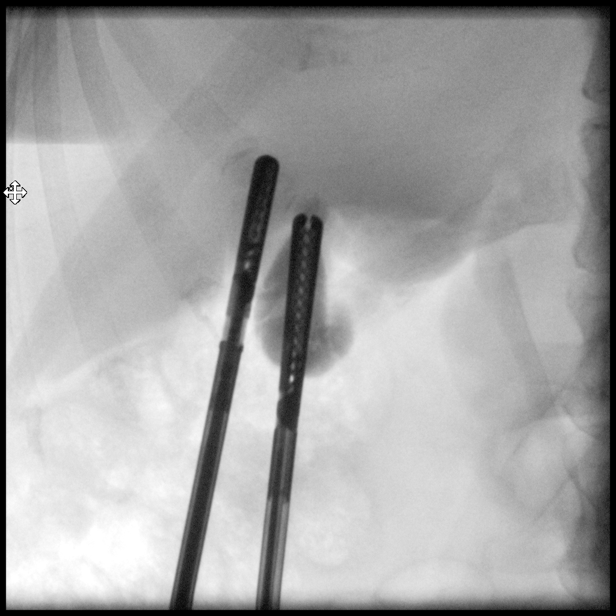
[frame 61/405]
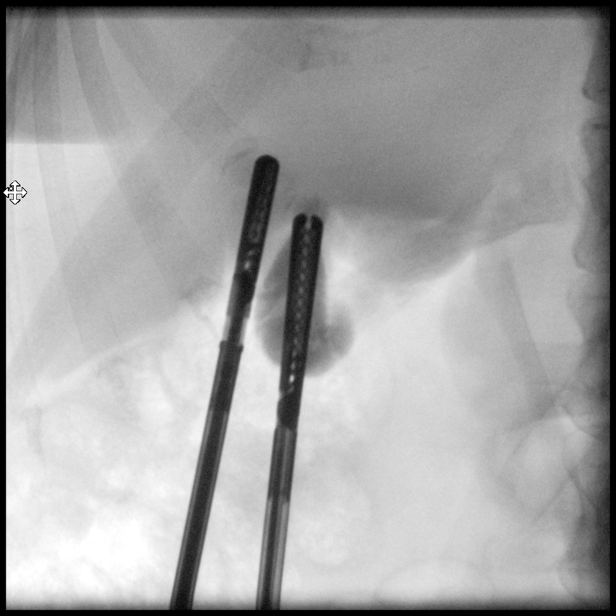
[frame 203/405]
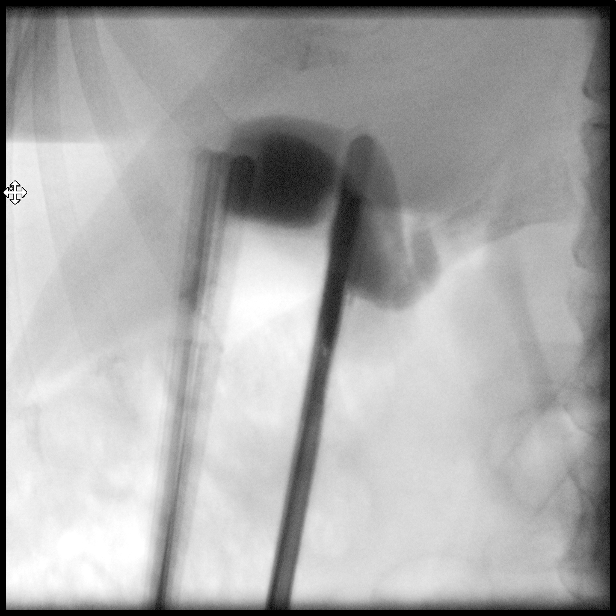
[frame 345/405]
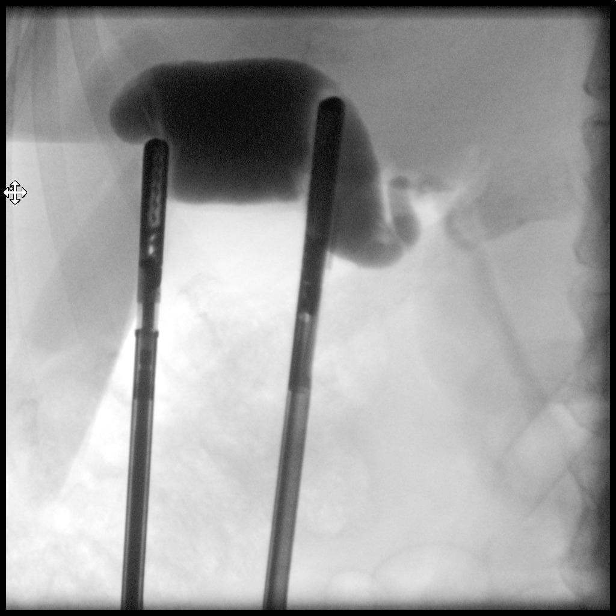

[Series 2: vasc standard · 4 of 71 frames shown (2 of 4)]
[frame 11/71]
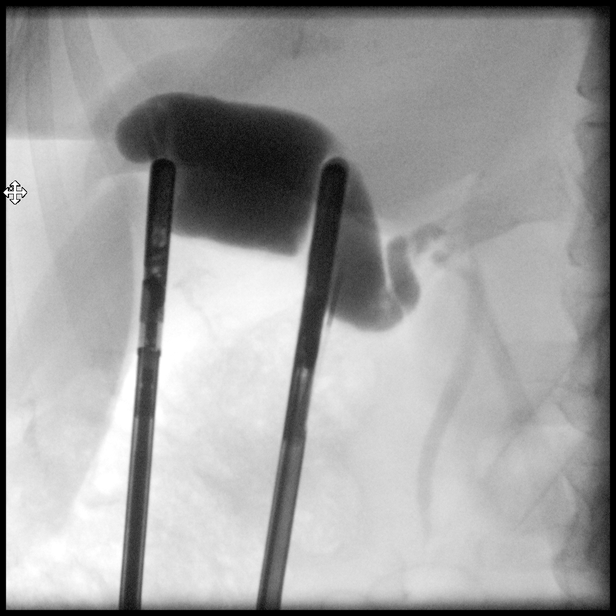
[frame 36/71]
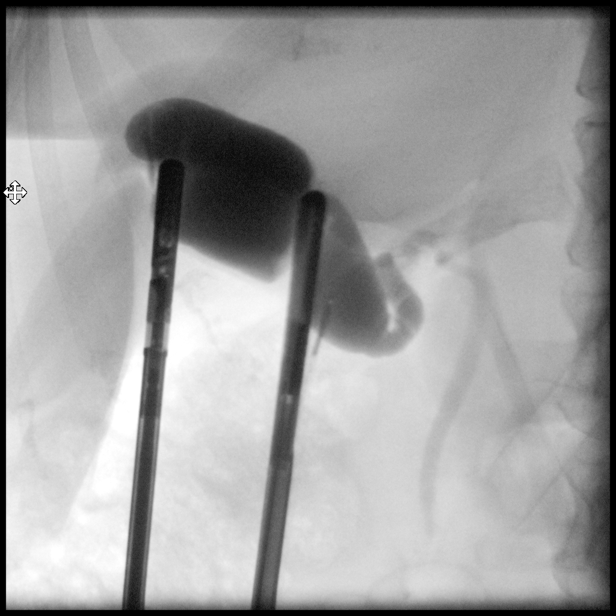
[frame 54/71]
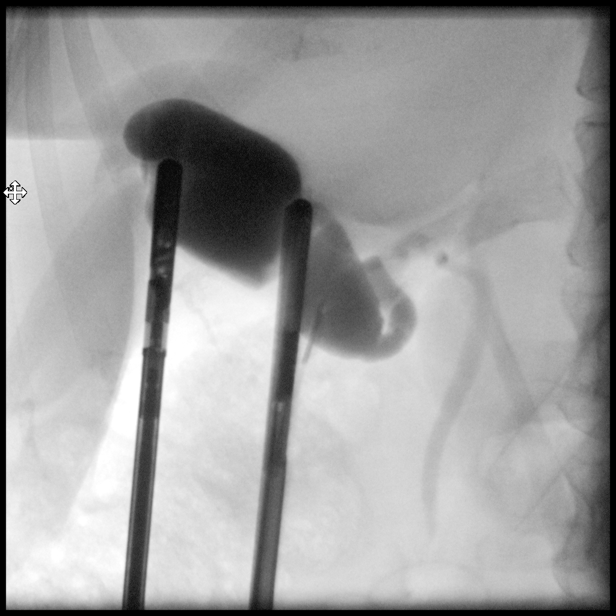
[frame 61/71]
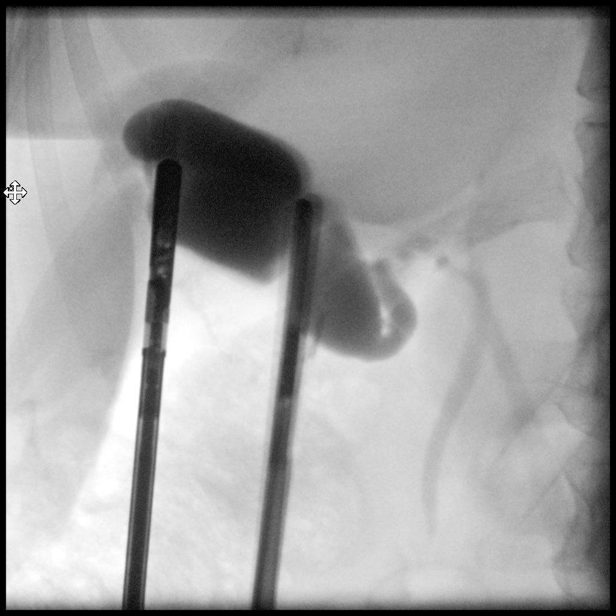

[Series 3: vasc standard · 4 of 220 frames shown (3 of 4)]
[frame 34/220]
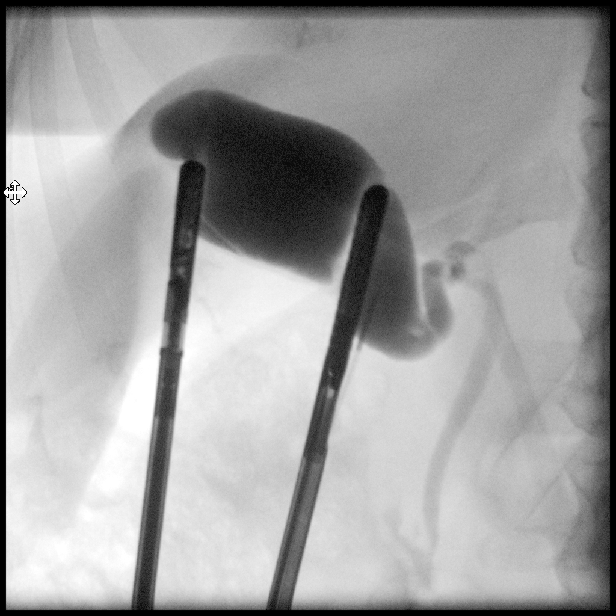
[frame 111/220]
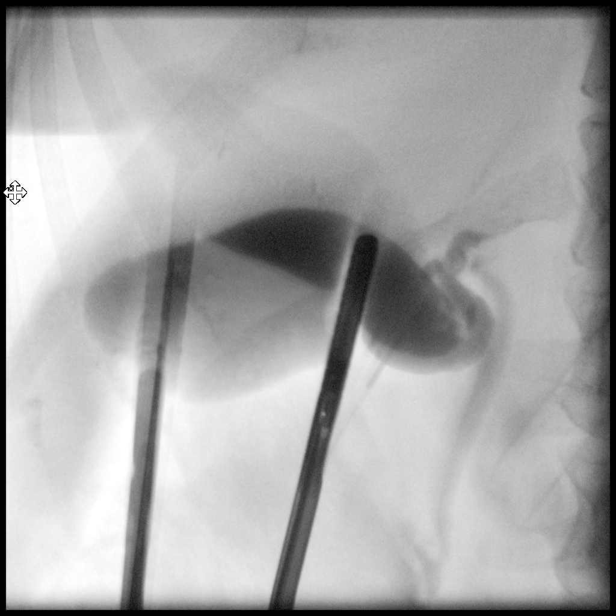
[frame 157/220]
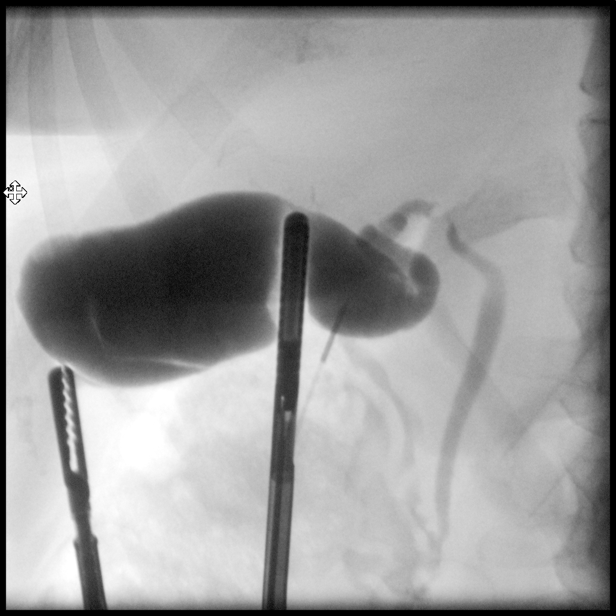
[frame 188/220]
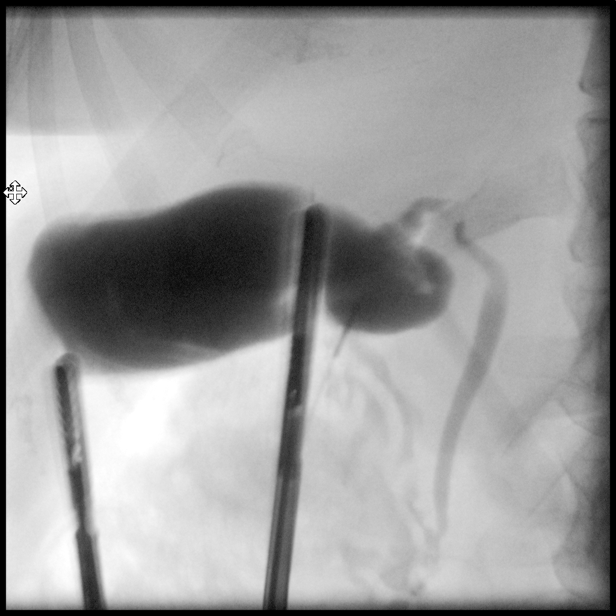

[Series 4: vasc standard · 1 of 1 slices shown (4 of 4)]
[im 1/1]
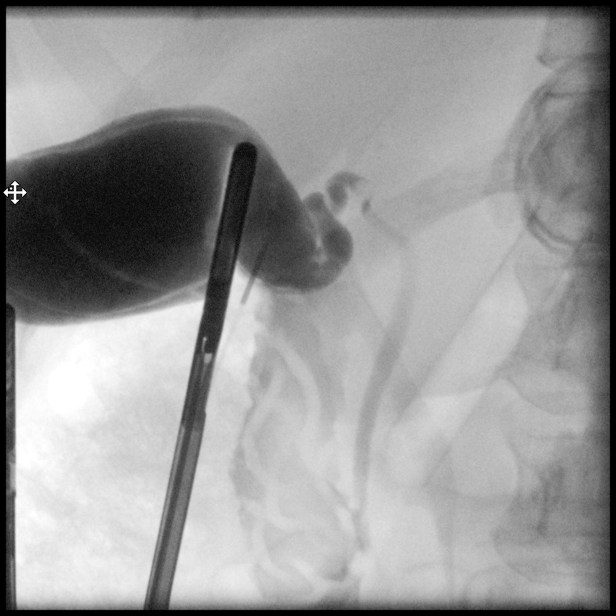

[13 of 13 positions shown; findings below may reference images not displayed]

FINDINGS: Four spot intraoperative cholangiographic images of the right upper
abdominal quadrant during laparoscopic cholecystectomy are provided
for review.

Contrast injection demonstrates opacification of the gallbladder
lumen.

There is passage of contrast through the central aspect of the
cystic duct with filling of a non dilated common bile duct. There is
passage of contrast though the CBD and into the descending portion
of the duodenum.

There is a persistent nonocclusive filling defect within the central
aspect of the cystic duct.

There are no additional persistent filling defects within the common
bile duct to suggest the presence of additional choledocholithiasis.
IMPRESSION: 1. Persistent nonocclusive filling defect within the central aspect
of the cystic duct, potentially representative of an air bubble,
though nonocclusive choledocholithiasis could have a similar
appearance. Clinical correlation is advised.
2. No discrete persistent intraluminal filling defects within the
common bile duct to suggest additional choledocholithiasis.

## 2020-05-23 IMAGING — US US ABDOMEN LIMITED
1 series · 14 of 25 positions shown · non-contrast
Comparison: 10/25/2017

CLINICAL DATA: Acute onset right-sided abdominal pain 5 hours ago.
Cholelithiasis.

EXAM:
ULTRASOUND ABDOMEN LIMITED RIGHT UPPER QUADRANT

[Series 1: us abdomen limited · 0.22mm/px · 14 of 41 slices shown]
[im 1/41]
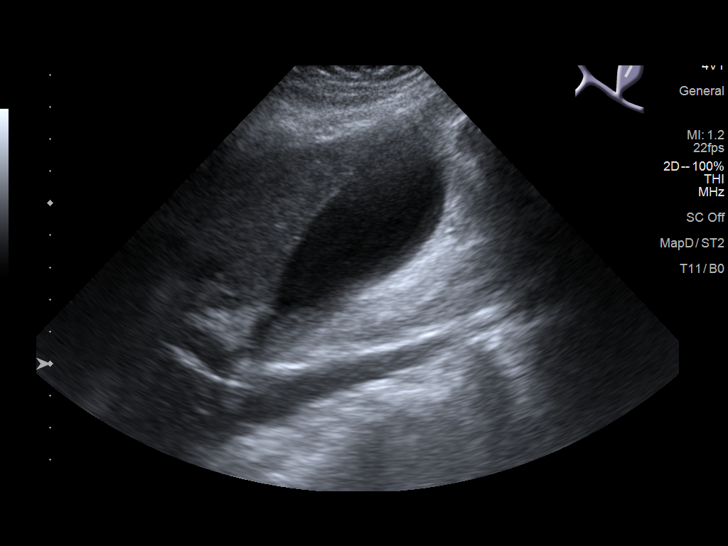
[im 4/41]
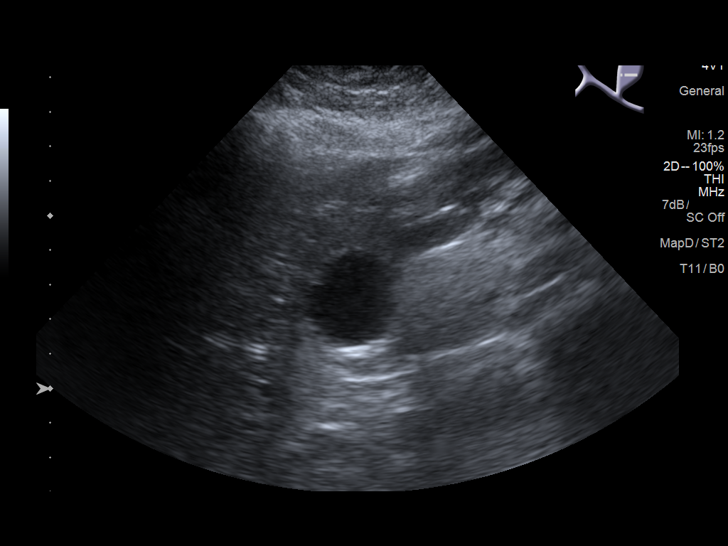
[im 7/41]
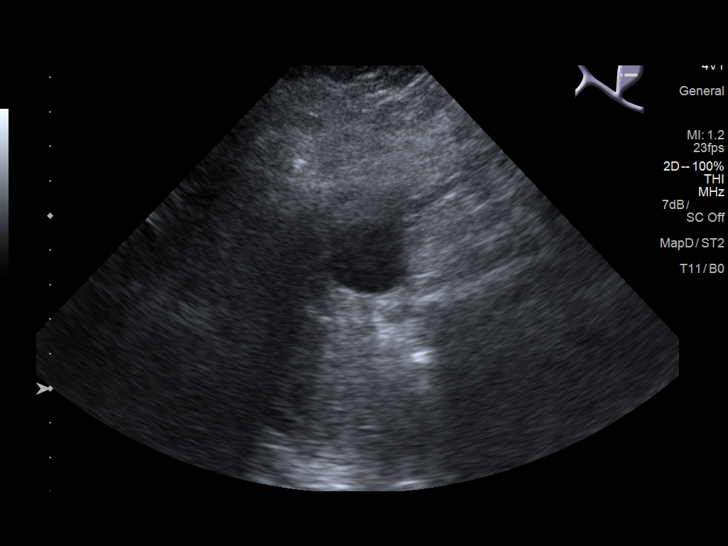
[im 11/41]
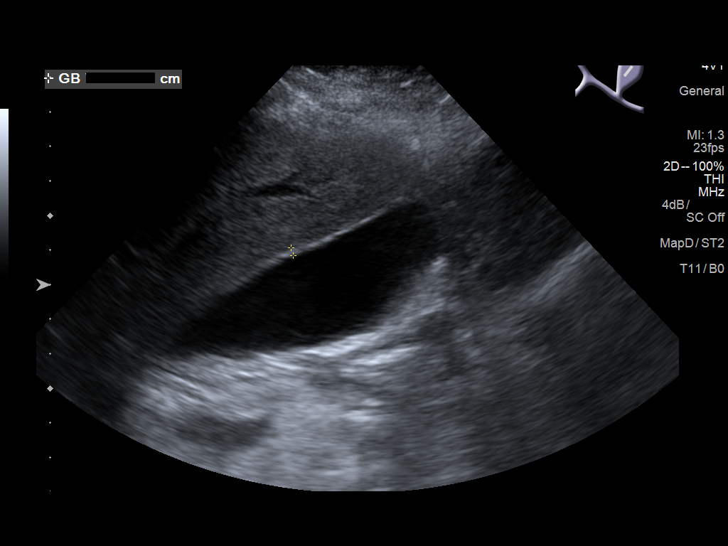
[im 14/41]
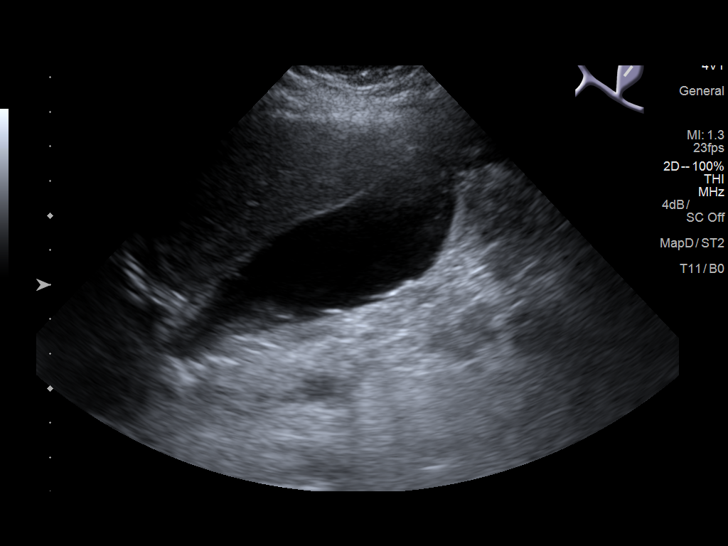
[im 16/41]
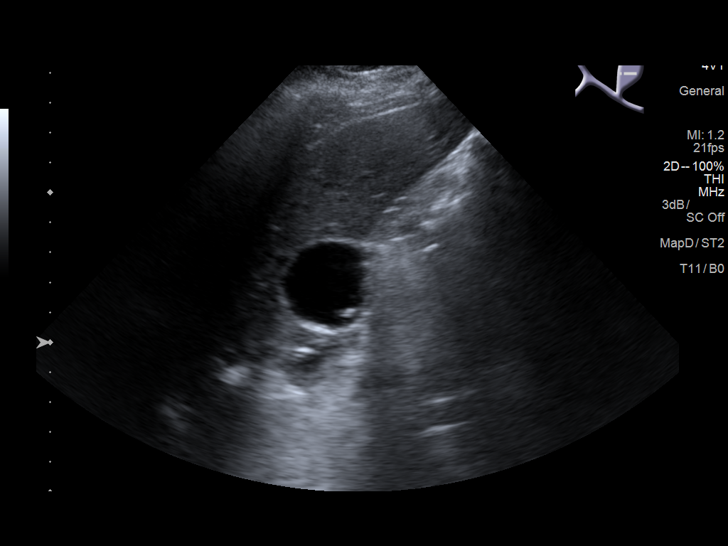
[im 19/41]
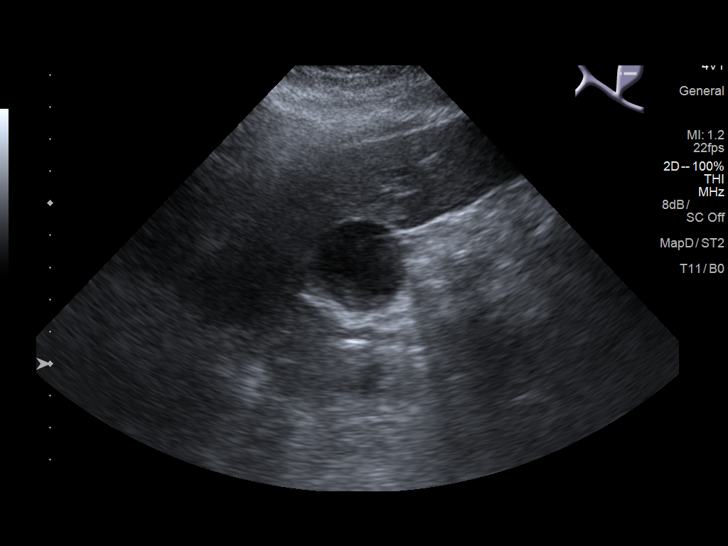
[im 22/41]
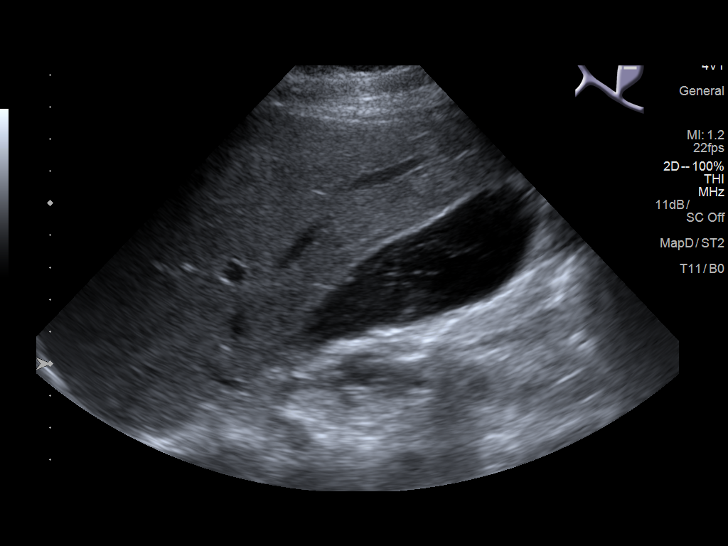
[im 26/41]
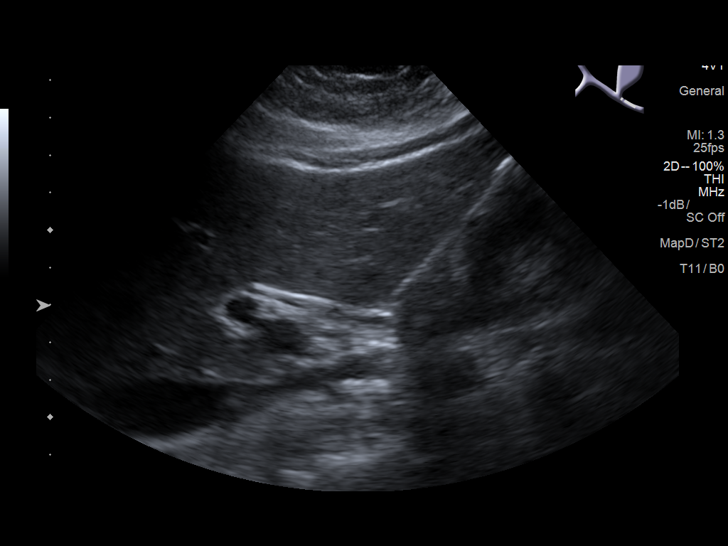
[im 27/41]
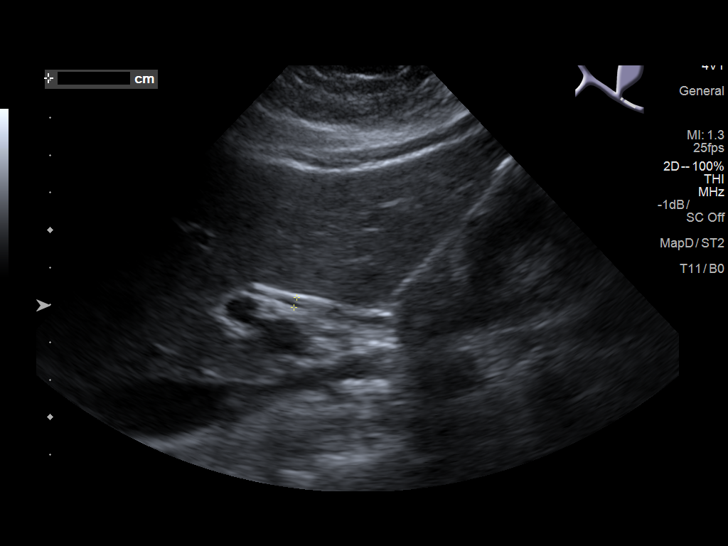
[im 31/41]
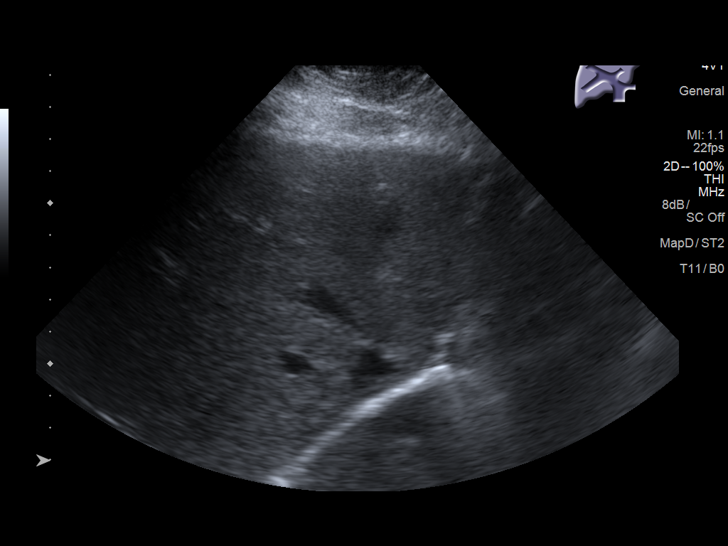
[im 34/41]
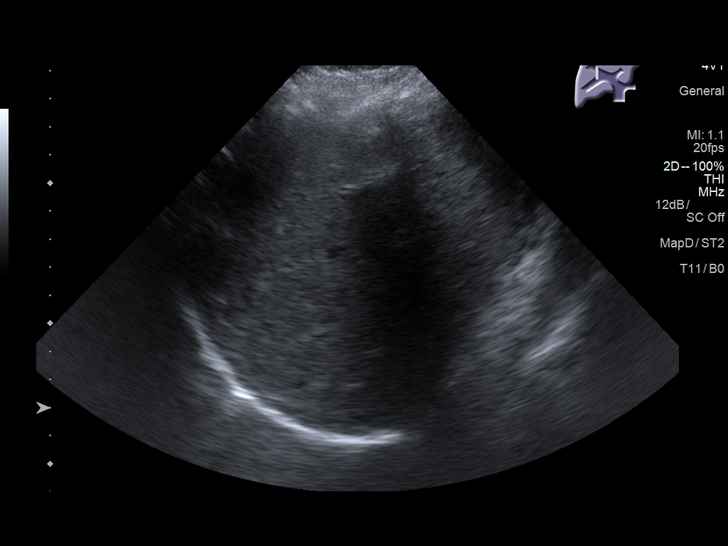
[im 37/41]
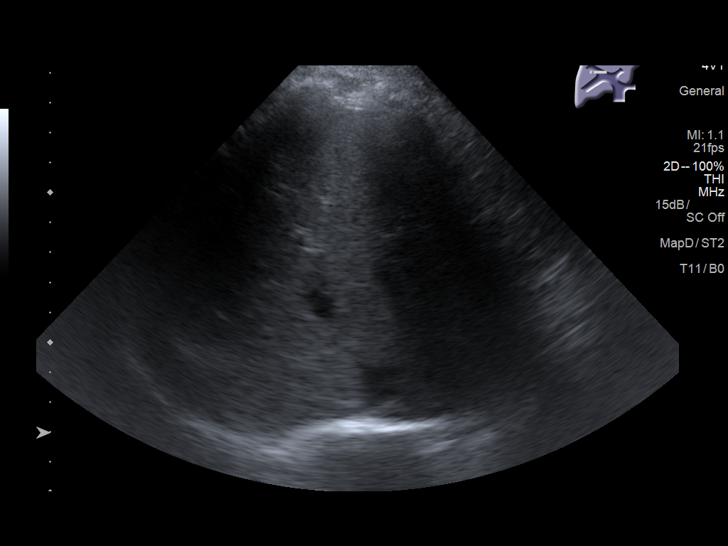
[im 41/41]
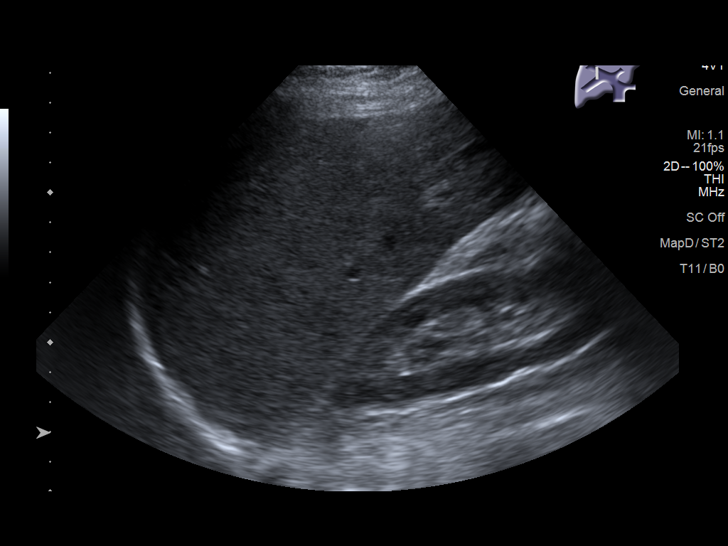

[14 of 25 positions shown; findings below may reference images not displayed]

FINDINGS: Gallbladder:

Several tiny gallstones are seen, largest measuring 6 mm. Echogenic
sludge also noted within gallbladder. No evidence of gallbladder
dilatation, wall thickening or pericholecystic fluid.

Common bile duct:

Diameter: 3 mm, within normal limits.

Liver:

No focal lesion identified. Within normal limits in parenchymal
echogenicity. Portal vein is patent on color Doppler imaging with
normal direction of blood flow towards the liver.
IMPRESSION: Cholelithiasis. No sonographic evidence of acute cholecystitis or
biliary ductal dilatation.

## 2020-12-09 ENCOUNTER — Other Ambulatory Visit: Payer: Self-pay | Admitting: Obstetrics and Gynecology

## 2020-12-09 DIAGNOSIS — Z1231 Encounter for screening mammogram for malignant neoplasm of breast: Secondary | ICD-10-CM

## 2021-01-26 ENCOUNTER — Ambulatory Visit
Admission: RE | Admit: 2021-01-26 | Discharge: 2021-01-26 | Disposition: A | Discharge status: Home / Self Care | Payer: 59 | Source: Ambulatory Visit | Attending: Obstetrics and Gynecology | Admitting: Obstetrics and Gynecology

## 2021-01-26 ENCOUNTER — Other Ambulatory Visit: Payer: Self-pay

## 2021-01-26 DIAGNOSIS — Z1231 Encounter for screening mammogram for malignant neoplasm of breast: Secondary | ICD-10-CM

## 2021-01-27 ENCOUNTER — Inpatient Hospital Stay
Admission: RE | Admit: 2021-01-27 | Discharge: 2021-01-27 | Disposition: A | Discharge status: Home / Self Care | Payer: Self-pay | Source: Ambulatory Visit | Attending: *Deleted | Admitting: *Deleted

## 2021-01-27 ENCOUNTER — Other Ambulatory Visit: Payer: Self-pay | Admitting: *Deleted

## 2021-01-27 DIAGNOSIS — Z1231 Encounter for screening mammogram for malignant neoplasm of breast: Secondary | ICD-10-CM

## 2021-08-19 ENCOUNTER — Other Ambulatory Visit: Payer: Self-pay | Admitting: Internal Medicine

## 2021-08-19 DIAGNOSIS — M549 Dorsalgia, unspecified: Secondary | ICD-10-CM

## 2021-08-19 DIAGNOSIS — Z13820 Encounter for screening for osteoporosis: Secondary | ICD-10-CM

## 2021-11-24 ENCOUNTER — Ambulatory Visit
Admission: RE | Admit: 2021-11-24 | Discharge: 2021-11-24 | Disposition: A | Discharge status: Home / Self Care | Payer: 59 | Source: Ambulatory Visit | Attending: Internal Medicine | Admitting: Internal Medicine

## 2021-11-24 DIAGNOSIS — Z13820 Encounter for screening for osteoporosis: Secondary | ICD-10-CM | POA: Diagnosis present

## 2021-11-24 DIAGNOSIS — M549 Dorsalgia, unspecified: Secondary | ICD-10-CM | POA: Diagnosis present

## 2022-06-30 ENCOUNTER — Other Ambulatory Visit: Payer: Self-pay | Admitting: Obstetrics and Gynecology

## 2022-06-30 DIAGNOSIS — M899 Disorder of bone, unspecified: Secondary | ICD-10-CM | POA: Diagnosis not present

## 2022-06-30 DIAGNOSIS — Z01419 Encounter for gynecological examination (general) (routine) without abnormal findings: Secondary | ICD-10-CM | POA: Diagnosis not present

## 2022-06-30 DIAGNOSIS — Z1231 Encounter for screening mammogram for malignant neoplasm of breast: Secondary | ICD-10-CM

## 2022-09-01 ENCOUNTER — Ambulatory Visit
Admission: RE | Admit: 2022-09-01 | Discharge: 2022-09-01 | Disposition: A | Discharge status: Home / Self Care | Payer: 59 | Source: Ambulatory Visit | Attending: Obstetrics and Gynecology | Admitting: Obstetrics and Gynecology

## 2022-09-01 DIAGNOSIS — Z1231 Encounter for screening mammogram for malignant neoplasm of breast: Secondary | ICD-10-CM | POA: Diagnosis not present

## 2022-09-06 DIAGNOSIS — E559 Vitamin D deficiency, unspecified: Secondary | ICD-10-CM | POA: Diagnosis not present

## 2022-09-06 DIAGNOSIS — E785 Hyperlipidemia, unspecified: Secondary | ICD-10-CM | POA: Diagnosis not present

## 2022-09-06 DIAGNOSIS — Z0000 Encounter for general adult medical examination without abnormal findings: Secondary | ICD-10-CM | POA: Diagnosis not present

## 2022-09-06 DIAGNOSIS — R5383 Other fatigue: Secondary | ICD-10-CM | POA: Diagnosis not present

## 2022-09-06 DIAGNOSIS — E039 Hypothyroidism, unspecified: Secondary | ICD-10-CM | POA: Diagnosis not present

## 2022-09-14 ENCOUNTER — Encounter: Payer: Self-pay | Admitting: Gastroenterology

## 2022-09-15 DIAGNOSIS — E039 Hypothyroidism, unspecified: Secondary | ICD-10-CM | POA: Diagnosis not present

## 2022-09-15 DIAGNOSIS — E559 Vitamin D deficiency, unspecified: Secondary | ICD-10-CM | POA: Diagnosis not present

## 2022-09-15 DIAGNOSIS — E785 Hyperlipidemia, unspecified: Secondary | ICD-10-CM | POA: Diagnosis not present

## 2022-09-15 DIAGNOSIS — R5383 Other fatigue: Secondary | ICD-10-CM | POA: Diagnosis not present

## 2022-09-20 DIAGNOSIS — L309 Dermatitis, unspecified: Secondary | ICD-10-CM | POA: Diagnosis not present

## 2022-10-13 DIAGNOSIS — L2089 Other atopic dermatitis: Secondary | ICD-10-CM | POA: Diagnosis not present

## 2022-10-13 DIAGNOSIS — M79671 Pain in right foot: Secondary | ICD-10-CM | POA: Diagnosis not present

## 2022-10-13 DIAGNOSIS — M79672 Pain in left foot: Secondary | ICD-10-CM | POA: Diagnosis not present

## 2022-10-31 DIAGNOSIS — H5500 Unspecified nystagmus: Secondary | ICD-10-CM | POA: Diagnosis not present

## 2022-10-31 DIAGNOSIS — H25041 Posterior subcapsular polar age-related cataract, right eye: Secondary | ICD-10-CM | POA: Diagnosis not present

## 2022-10-31 DIAGNOSIS — H35353 Cystoid macular degeneration, bilateral: Secondary | ICD-10-CM | POA: Diagnosis not present

## 2022-10-31 DIAGNOSIS — H3550 Unspecified hereditary retinal dystrophy: Secondary | ICD-10-CM | POA: Diagnosis not present

## 2023-01-03 DIAGNOSIS — H25041 Posterior subcapsular polar age-related cataract, right eye: Secondary | ICD-10-CM | POA: Diagnosis not present

## 2023-01-03 DIAGNOSIS — H35352 Cystoid macular degeneration, left eye: Secondary | ICD-10-CM | POA: Diagnosis not present

## 2023-01-03 DIAGNOSIS — H3552 Pigmentary retinal dystrophy: Secondary | ICD-10-CM | POA: Diagnosis not present

## 2023-01-03 DIAGNOSIS — H35351 Cystoid macular degeneration, right eye: Secondary | ICD-10-CM | POA: Diagnosis not present

## 2023-01-17 DIAGNOSIS — H25041 Posterior subcapsular polar age-related cataract, right eye: Secondary | ICD-10-CM | POA: Diagnosis not present

## 2023-01-19 ENCOUNTER — Encounter: Payer: Self-pay | Admitting: Ophthalmology

## 2023-01-20 NOTE — Anesthesia Preprocedure Evaluation (Signed)
Anesthesia Evaluation  Patient identified by MRN, date of birth, ID band Patient awake    Reviewed: Allergy & Precautions, H&P , NPO status , Patient's Chart, lab work & pertinent test results  Airway Mallampati: III  TM Distance: >3 FB Neck ROM: Full    Dental no notable dental hx.  Denies caps or crowns:   Pulmonary neg pulmonary ROS   Pulmonary exam normal breath sounds clear to auscultation       Cardiovascular negative cardio ROS Normal cardiovascular exam Rhythm:Regular Rate:Normal     Neuro/Psych negative neurological ROS  negative psych ROS   GI/Hepatic negative GI ROS, Neg liver ROS,,,  Endo/Other  negative endocrine ROSHypothyroidism    Renal/GU negative Renal ROS  negative genitourinary   Musculoskeletal negative musculoskeletal ROS (+)    Abdominal   Peds negative pediatric ROS (+)  Hematology negative hematology ROS (+)   Anesthesia Other Findings Osteopenia Hyperlipidemia Hypothyroidism   Reproductive/Obstetrics negative OB ROS                              Anesthesia Physical Anesthesia Plan  ASA: 2  Anesthesia Plan: MAC   Post-op Pain Management:    Induction: Intravenous  PONV Risk Score and Plan:   Airway Management Planned: Natural Airway and Nasal Cannula  Additional Equipment:   Intra-op Plan:   Post-operative Plan:   Informed Consent: I have reviewed the patients History and Physical, chart, labs and discussed the procedure including the risks, benefits and alternatives for the proposed anesthesia with the patient or authorized representative who has indicated his/her understanding and acceptance.     Dental Advisory Given  Plan Discussed with: Anesthesiologist, CRNA and Surgeon  Anesthesia Plan Comments: (Patient consented for risks of anesthesia including but not limited to:  - adverse reactions to medications - damage to eyes, teeth, lips  or other oral mucosa - nerve damage due to positioning  - sore throat or hoarseness - Damage to heart, brain, nerves, lungs, other parts of body or loss of life  Patient voiced understanding.)        Anesthesia Quick Evaluation

## 2023-01-23 NOTE — Discharge Instructions (Signed)

## 2023-01-25 ENCOUNTER — Ambulatory Visit: Payer: 59 | Admitting: Anesthesiology

## 2023-01-25 ENCOUNTER — Other Ambulatory Visit: Payer: Self-pay

## 2023-01-25 ENCOUNTER — Ambulatory Visit
Admission: RE | Admit: 2023-01-25 | Discharge: 2023-01-25 | Disposition: A | Discharge status: Home / Self Care | Payer: 59 | Attending: Ophthalmology | Admitting: Ophthalmology

## 2023-01-25 ENCOUNTER — Encounter: Payer: Self-pay | Admitting: Ophthalmology

## 2023-01-25 ENCOUNTER — Encounter
Admission: RE | Disposition: A | Discharge status: Home / Self Care | Payer: Self-pay | Source: Home / Self Care | Attending: Ophthalmology

## 2023-01-25 DIAGNOSIS — H2511 Age-related nuclear cataract, right eye: Secondary | ICD-10-CM | POA: Diagnosis not present

## 2023-01-25 DIAGNOSIS — E039 Hypothyroidism, unspecified: Secondary | ICD-10-CM | POA: Insufficient documentation

## 2023-01-25 DIAGNOSIS — M8580 Other specified disorders of bone density and structure, unspecified site: Secondary | ICD-10-CM | POA: Insufficient documentation

## 2023-01-25 DIAGNOSIS — E785 Hyperlipidemia, unspecified: Secondary | ICD-10-CM | POA: Diagnosis not present

## 2023-01-25 DIAGNOSIS — H269 Unspecified cataract: Secondary | ICD-10-CM | POA: Diagnosis not present

## 2023-01-25 HISTORY — PX: CATARACT EXTRACTION W/PHACO: SHX586

## 2023-01-25 HISTORY — DX: Hypothyroidism, unspecified: E03.9

## 2023-01-25 SURGERY — PHACOEMULSIFICATION, CATARACT, WITH IOL INSERTION
Anesthesia: Monitor Anesthesia Care | Laterality: Right

## 2023-01-25 MED ORDER — SIGHTPATH DOSE#1 BSS IO SOLN
INTRAOCULAR | Status: DC | PRN
  Administered 2023-01-25: 2 mL

## 2023-01-25 MED ORDER — ARMC OPHTHALMIC DILATING DROPS
1.0000 | OPHTHALMIC | Status: DC | PRN
  Administered 2023-01-25 (×3): 1 via OPHTHALMIC

## 2023-01-25 MED ORDER — CEFUROXIME OPHTHALMIC INJECTION 1 MG/0.1 ML
INJECTION | OPHTHALMIC | Status: DC | PRN
  Administered 2023-01-25: 1 mg via INTRACAMERAL

## 2023-01-25 MED ORDER — FENTANYL CITRATE (PF) 100 MCG/2ML IJ SOLN
INTRAMUSCULAR | Status: DC | PRN
  Administered 2023-01-25: 50 ug via INTRAVENOUS

## 2023-01-25 MED ORDER — LACTATED RINGERS IV SOLN: INTRAVENOUS | Status: DC

## 2023-01-25 MED ORDER — SIGHTPATH DOSE#1 NA HYALUR & NA CHOND-NA HYALUR IO KIT
PACK | INTRAOCULAR | Status: DC | PRN
  Administered 2023-01-25: 1 via OPHTHALMIC

## 2023-01-25 MED ORDER — MIDAZOLAM HCL 2 MG/2ML IJ SOLN
INTRAMUSCULAR | Status: DC | PRN
  Administered 2023-01-25 (×2): 1 mg via INTRAVENOUS

## 2023-01-25 MED ORDER — SIGHTPATH DOSE#1 BSS IO SOLN
INTRAOCULAR | Status: DC | PRN
  Administered 2023-01-25: 15 mL via INTRAOCULAR

## 2023-01-25 MED ORDER — SIGHTPATH DOSE#1 BSS IO SOLN
INTRAOCULAR | Status: DC | PRN
  Administered 2023-01-25: 51 mL via OPHTHALMIC

## 2023-01-25 MED ORDER — TETRACAINE HCL 0.5 % OP SOLN
1.0000 [drp] | OPHTHALMIC | Status: DC | PRN
  Administered 2023-01-25 (×2): 1 [drp] via OPHTHALMIC

## 2023-01-25 MED ORDER — BRIMONIDINE TARTRATE-TIMOLOL 0.2-0.5 % OP SOLN
OPHTHALMIC | Status: DC | PRN
  Administered 2023-01-25: 1 [drp] via OPHTHALMIC

## 2023-01-25 SURGICAL SUPPLY — 9 items
CATARACT SUITE SIGHTPATH (MISCELLANEOUS) ×1 IMPLANT
FEE CATARACT SUITE SIGHTPATH (MISCELLANEOUS) ×1 IMPLANT
GLOVE SRG 8 PF TXTR STRL LF DI (GLOVE) ×1 IMPLANT
GLOVE SURG ENC TEXT LTX SZ7.5 (GLOVE) ×1 IMPLANT
GLOVE SURG UNDER POLY LF SZ8 (GLOVE) ×1
LENS IOL TECNIS EYHANCE 23.5 (Intraocular Lens) IMPLANT
NDL FILTER BLUNT 18X1 1/2 (NEEDLE) ×1 IMPLANT
NEEDLE FILTER BLUNT 18X1 1/2 (NEEDLE) ×1 IMPLANT
SYR 3ML LL SCALE MARK (SYRINGE) ×1 IMPLANT

## 2023-01-25 NOTE — Op Note (Signed)
  LOCATION:  [facility]   PREOPERATIVE DIAGNOSIS:    Nuclear sclerotic cataract right eye. H25.11   POSTOPERATIVE DIAGNOSIS:  Nuclear sclerotic cataract right eye.     PROCEDURE:  Phacoemusification with posterior chamber intraocular lens placement of the right eye   ULTRASOUND TIME: Procedure(s): CATARACT EXTRACTION PHACO AND INTRAOCULAR LENS PLACEMENT (IOC) RIGHT 6.36 00:37.3 (Right)  LENS:   Implant Name Type Inv. Item Serial No. Manufacturer Lot No. LRB No. Used Action  LENS IOL TECNIS EYHANCE 23.5 - [id number] Intraocular Lens LENS IOL TECNIS EYHANCE 23.5 [id number] [employer]  Right 1 Implanted         SURGEON:  Deirdre Evener, MD   ANESTHESIA:  Topical with tetracaine drops and 2% Xylocaine jelly, augmented with 1% preservative-free intracameral lidocaine.    COMPLICATIONS:  None.   DESCRIPTION OF PROCEDURE:  The patient was identified in the holding room and transported to the operating room and placed in the supine position under the operating microscope.  The right eye was identified as the operative eye and it was prepped and draped in the usual sterile ophthalmic fashion.   A 1 millimeter clear-corneal paracentesis was made at the 12:00 position.  0.5 ml of preservative-free 1% lidocaine was injected into the anterior chamber. The anterior chamber was filled with Viscoat viscoelastic.  A 2.4 millimeter keratome was used to make a near-clear corneal incision at the 9:00 position.  A curvilinear capsulorrhexis was made with a cystotome and capsulorrhexis forceps.  Balanced salt solution was used to hydrodissect and hydrodelineate the nucleus.   Phacoemulsification was then used in stop and chop fashion to remove the lens nucleus and epinucleus.  The remaining cortex was then removed using the irrigation and aspiration handpiece. Provisc was then placed into the capsular bag to distend it for lens placement.  A lens was then injected into the capsular bag.   The remaining viscoelastic was aspirated.   Wounds were hydrated with balanced salt solution.  The anterior chamber was inflated to a physiologic pressure with balanced salt solution.  No wound leaks were noted. Cefuroxime 0.1 ml of a 10mg /ml solution was injected into the anterior chamber for a dose of 1 mg of intracameral antibiotic at the completion of the case.   Timolol and Brimonidine drops were applied to the eye.  The patient was taken to the recovery room in stable condition without complications of anesthesia or surgery.   Erika Lloyd 01/25/2023, 11:50 AM

## 2023-01-25 NOTE — Transfer of Care (Signed)
Immediate Anesthesia Transfer of Care Note  Patient: Erika Lloyd  Procedure(s) Performed: CATARACT EXTRACTION PHACO AND INTRAOCULAR LENS PLACEMENT (IOC) RIGHT 6.36 00:37.3 (Right)  Patient Location: PACU  Anesthesia Type: MAC  Level of Consciousness: awake, alert  and patient cooperative  Airway and Oxygen Therapy: Patient Spontanous Breathing and Patient connected to supplemental oxygen  Post-op Assessment: Post-op Vital signs reviewed, Patient's Cardiovascular Status Stable, Respiratory Function Stable, Patent Airway and No signs of Nausea or vomiting  Post-op Vital Signs: Reviewed and stable  Complications: No notable events documented.

## 2023-01-25 NOTE — Anesthesia Postprocedure Evaluation (Signed)
Anesthesia Post Note  Patient: Erika Lloyd  Procedure(s) Performed: CATARACT EXTRACTION PHACO AND INTRAOCULAR LENS PLACEMENT (IOC) RIGHT 6.36 00:37.3 (Right)  Patient location during evaluation: PACU Anesthesia Type: MAC Level of consciousness: awake and alert Pain management: pain level controlled Vital Signs Assessment: post-procedure vital signs reviewed and stable Respiratory status: spontaneous breathing, nonlabored ventilation, respiratory function stable and patient connected to nasal cannula oxygen Cardiovascular status: stable and blood pressure returned to baseline Postop Assessment: no apparent nausea or vomiting Anesthetic complications: no   No notable events documented.   Last Vitals:  Vitals:   01/25/23 1152 01/25/23 1156  BP: 119/62 113/63  Pulse: 79 72  Resp: 14 16  Temp: (!) 36.4 Lloyd (!) 36.4 Lloyd  SpO2: 98% 98%    Last Pain:  Vitals:   01/25/23 1156  TempSrc:   PainSc: 0-No pain                 Erika Lloyd Erika Lloyd

## 2023-01-25 NOTE — H&P (Signed)
  [facility]   Primary Care Physician:  Sherrie Mustache, MD Ophthalmologist: Dr. Lockie Mola  Pre-Procedure History & Physical: HPI:  Erika Lloyd is a 57 y.o. female here for ophthalmic surgery.   Past Medical History:  Diagnosis Date   Hyperlipidemia    Hypothyroidism    Osteopenia    Thyroid disease     Past Surgical History:  Procedure Laterality Date   CESAREAN SECTION     3x   CHOLECYSTECTOMY N/A 04/18/2018   Procedure: LAPAROSCOPIC CHOLECYSTECTOMY WITH INTRAOPERATIVE CHOLANGIOGRAM;  Surgeon: Earline Mayotte, MD;  Location: [facility] ORS;  Service: General;  Laterality: N/A;   EYE SURGERY     INNER EAR SURGERY      Prior to Admission medications   Medication Sig Start Date End Date Taking? Authorizing Provider  atorvastatin (LIPITOR) 10 MG tablet Take 10 mg by mouth daily.   Yes [provider]  Cholecalciferol (VITAMIN D3 PO) Take 1 capsule by mouth daily.   Yes [provider]  levothyroxine (SYNTHROID) 50 MCG tablet Take 50 mcg by mouth daily before breakfast.   Yes [provider]    Allergies as of 01/06/2023   (No Known Allergies)    History reviewed. No pertinent family history.  Social History   Socioeconomic History   Marital status: Married    Spouse name: Not on file   Number of children: Not on file   Years of education: Not on file   Highest education level: Not on file  Occupational History   Not on file  Tobacco Use   Smoking status: Never   Smokeless tobacco: Never  Vaping Use   Vaping Use: Never used  Substance and Sexual Activity   Alcohol use: Never   Drug use: Never   Sexual activity: Not on file  Other Topics Concern   Not on file  Social History Narrative   Not on file   Social Determinants of Health   Financial Resource Strain: Not on file  Food Insecurity: Not on file  Transportation Needs: Not on file  Physical Activity: Not on file  Stress: Not on file  Social Connections: Not  on file  Intimate Partner Violence: Not on file    Review of Systems: See HPI, otherwise negative ROS  Physical Exam: BP 136/79   Pulse 85   Temp 97.9 F (36.6 C) (Temporal)   Resp 18   Ht 5\' 2"  (1.575 m)   Wt 63.6 kg   SpO2 100%   BMI 25.66 kg/m  General:   Alert,  pleasant and cooperative in NAD Head:  Normocephalic and atraumatic. Lungs:  Clear to auscultation.    Heart:  Regular rate and rhythm.   Impression/Plan: Erika Lloyd is here for ophthalmic surgery.  Risks, benefits, limitations, and alternatives regarding ophthalmic surgery have been reviewed with the patient.  Questions have been answered.  All parties agreeable.   Lockie Mola, MD  01/25/2023, 11:15 AM

## 2023-01-26 ENCOUNTER — Encounter: Payer: Self-pay | Admitting: Ophthalmology

## 2023-02-05 DIAGNOSIS — M25551 Pain in right hip: Secondary | ICD-10-CM | POA: Diagnosis not present

## 2023-02-06 DIAGNOSIS — E785 Hyperlipidemia, unspecified: Secondary | ICD-10-CM | POA: Diagnosis not present

## 2023-02-06 DIAGNOSIS — E039 Hypothyroidism, unspecified: Secondary | ICD-10-CM | POA: Diagnosis not present

## 2023-02-06 DIAGNOSIS — R945 Abnormal results of liver function studies: Secondary | ICD-10-CM | POA: Diagnosis not present

## 2023-02-06 DIAGNOSIS — M129 Arthropathy, unspecified: Secondary | ICD-10-CM | POA: Diagnosis not present

## 2023-02-06 DIAGNOSIS — E559 Vitamin D deficiency, unspecified: Secondary | ICD-10-CM | POA: Diagnosis not present

## 2023-02-06 DIAGNOSIS — R5383 Other fatigue: Secondary | ICD-10-CM | POA: Diagnosis not present

## 2023-02-07 DIAGNOSIS — E039 Hypothyroidism, unspecified: Secondary | ICD-10-CM | POA: Diagnosis not present

## 2023-02-07 DIAGNOSIS — E559 Vitamin D deficiency, unspecified: Secondary | ICD-10-CM | POA: Diagnosis not present

## 2023-02-07 DIAGNOSIS — R5383 Other fatigue: Secondary | ICD-10-CM | POA: Diagnosis not present

## 2023-02-07 DIAGNOSIS — R945 Abnormal results of liver function studies: Secondary | ICD-10-CM | POA: Diagnosis not present

## 2023-02-07 DIAGNOSIS — E785 Hyperlipidemia, unspecified: Secondary | ICD-10-CM | POA: Diagnosis not present

## 2023-02-07 DIAGNOSIS — M129 Arthropathy, unspecified: Secondary | ICD-10-CM | POA: Diagnosis not present

## 2023-02-20 DIAGNOSIS — H35353 Cystoid macular degeneration, bilateral: Secondary | ICD-10-CM | POA: Diagnosis not present

## 2023-02-20 DIAGNOSIS — Z961 Presence of intraocular lens: Secondary | ICD-10-CM | POA: Diagnosis not present

## 2023-02-22 DIAGNOSIS — M25551 Pain in right hip: Secondary | ICD-10-CM | POA: Diagnosis not present

## 2023-02-22 DIAGNOSIS — M47816 Spondylosis without myelopathy or radiculopathy, lumbar region: Secondary | ICD-10-CM | POA: Diagnosis not present

## 2023-02-22 DIAGNOSIS — M533 Sacrococcygeal disorders, not elsewhere classified: Secondary | ICD-10-CM | POA: Diagnosis not present

## 2024-02-12 ENCOUNTER — Ambulatory Visit: Payer: PRIVATE HEALTH INSURANCE | Admitting: Diagnostic Neuroimaging

## 2024-03-19 ENCOUNTER — Ambulatory Visit: Payer: PRIVATE HEALTH INSURANCE | Admitting: Diagnostic Neuroimaging

## 2024-06-03 ENCOUNTER — Other Ambulatory Visit: Payer: Self-pay | Admitting: Internal Medicine

## 2024-06-03 DIAGNOSIS — Z1231 Encounter for screening mammogram for malignant neoplasm of breast: Secondary | ICD-10-CM

## 2024-06-05 LAB — COLOGUARD: COLOGUARD: NEGATIVE

## 2024-07-05 ENCOUNTER — Ambulatory Visit
Admission: RE | Admit: 2024-07-05 | Discharge: 2024-07-05 | Disposition: A | Discharge status: Home / Self Care | Payer: PRIVATE HEALTH INSURANCE | Source: Ambulatory Visit | Attending: Internal Medicine | Admitting: Internal Medicine

## 2024-07-05 DIAGNOSIS — Z1231 Encounter for screening mammogram for malignant neoplasm of breast: Secondary | ICD-10-CM | POA: Diagnosis present
